# Patient Record
Sex: Female | Born: 1955 | Race: White | Hispanic: No | Marital: Married | Smoking: Former smoker
Health system: Southern US, Community
[De-identification: ages and names within clinical notes are randomized; demographics above are authoritative.]

## PROBLEM LIST (undated history)

## (undated) DIAGNOSIS — Z87442 Personal history of urinary calculi: Secondary | ICD-10-CM

## (undated) DIAGNOSIS — K760 Fatty (change of) liver, not elsewhere classified: Secondary | ICD-10-CM

## (undated) DIAGNOSIS — J449 Chronic obstructive pulmonary disease, unspecified: Secondary | ICD-10-CM

## (undated) DIAGNOSIS — F909 Attention-deficit hyperactivity disorder, unspecified type: Secondary | ICD-10-CM

## (undated) DIAGNOSIS — M1711 Unilateral primary osteoarthritis, right knee: Secondary | ICD-10-CM

## (undated) DIAGNOSIS — E559 Vitamin D deficiency, unspecified: Secondary | ICD-10-CM

## (undated) DIAGNOSIS — Z87898 Personal history of other specified conditions: Secondary | ICD-10-CM

## (undated) DIAGNOSIS — J301 Allergic rhinitis due to pollen: Secondary | ICD-10-CM

## (undated) DIAGNOSIS — U071 COVID-19: Secondary | ICD-10-CM

## (undated) DIAGNOSIS — M461 Sacroiliitis, not elsewhere classified: Secondary | ICD-10-CM

## (undated) DIAGNOSIS — G43109 Migraine with aura, not intractable, without status migrainosus: Secondary | ICD-10-CM

## (undated) DIAGNOSIS — K219 Gastro-esophageal reflux disease without esophagitis: Secondary | ICD-10-CM

## (undated) DIAGNOSIS — Z9884 Bariatric surgery status: Secondary | ICD-10-CM

## (undated) DIAGNOSIS — Z972 Presence of dental prosthetic device (complete) (partial): Secondary | ICD-10-CM

## (undated) DIAGNOSIS — G4733 Obstructive sleep apnea (adult) (pediatric): Secondary | ICD-10-CM

## (undated) HISTORY — DX: Allergic rhinitis due to pollen: J30.1

## (undated) HISTORY — PX: GASTRIC ROUX-EN-Y: SHX5262

## (undated) HISTORY — DX: Fatty (change of) liver, not elsewhere classified: K76.0

## (undated) HISTORY — DX: Bariatric surgery status: Z98.84

## (undated) HISTORY — DX: Vitamin D deficiency, unspecified: E55.9

## (undated) HISTORY — DX: Sacroiliitis, not elsewhere classified: M46.1

## (undated) HISTORY — DX: Personal history of other specified conditions: Z87.898

## (undated) HISTORY — DX: Unilateral primary osteoarthritis, right knee: M17.11

## (undated) HISTORY — DX: Gastro-esophageal reflux disease without esophagitis: K21.9

## (undated) HISTORY — DX: Migraine with aura, not intractable, without status migrainosus: G43.109

## (undated) HISTORY — PX: NASAL SINUS SURGERY: SHX719

## (undated) HISTORY — DX: Attention-deficit hyperactivity disorder, unspecified type: F90.9

## (undated) HISTORY — DX: Obstructive sleep apnea (adult) (pediatric): G47.33

---

## 2007-03-08 ENCOUNTER — Emergency Department: Payer: Self-pay | Admitting: Internal Medicine

## 2007-03-08 ENCOUNTER — Other Ambulatory Visit: Payer: Self-pay

## 2007-10-05 ENCOUNTER — Emergency Department: Payer: Self-pay | Admitting: Emergency Medicine

## 2007-12-16 ENCOUNTER — Emergency Department: Payer: Self-pay | Admitting: Emergency Medicine

## 2008-05-13 ENCOUNTER — Ambulatory Visit: Payer: Self-pay

## 2008-06-26 ENCOUNTER — Ambulatory Visit: Payer: Self-pay | Admitting: Family Medicine

## 2012-01-24 ENCOUNTER — Ambulatory Visit: Payer: Self-pay | Admitting: Family Medicine

## 2012-03-14 ENCOUNTER — Emergency Department: Payer: Self-pay | Admitting: Emergency Medicine

## 2012-07-20 ENCOUNTER — Emergency Department: Payer: Self-pay | Admitting: Internal Medicine

## 2013-02-24 ENCOUNTER — Emergency Department: Payer: Self-pay | Admitting: Emergency Medicine

## 2013-04-25 ENCOUNTER — Emergency Department: Payer: Self-pay | Admitting: Emergency Medicine

## 2014-01-19 ENCOUNTER — Emergency Department: Payer: Self-pay | Admitting: Emergency Medicine

## 2014-01-21 ENCOUNTER — Emergency Department: Payer: Self-pay | Admitting: Emergency Medicine

## 2014-01-25 ENCOUNTER — Emergency Department: Payer: Self-pay | Admitting: Internal Medicine

## 2014-02-04 ENCOUNTER — Emergency Department: Payer: Self-pay | Admitting: Emergency Medicine

## 2014-02-10 ENCOUNTER — Emergency Department: Payer: Self-pay | Admitting: Emergency Medicine

## 2014-07-14 ENCOUNTER — Emergency Department: Payer: Self-pay | Admitting: Emergency Medicine

## 2015-03-18 ENCOUNTER — Ambulatory Visit: Payer: Self-pay | Attending: Oncology | Admitting: *Deleted

## 2015-03-18 ENCOUNTER — Encounter (INDEPENDENT_AMBULATORY_CARE_PROVIDER_SITE_OTHER): Payer: Self-pay

## 2015-03-18 ENCOUNTER — Ambulatory Visit
Admission: RE | Admit: 2015-03-18 | Discharge: 2015-03-18 | Disposition: A | Discharge status: Home / Self Care | Payer: Self-pay | Source: Ambulatory Visit | Attending: Oncology | Admitting: Oncology

## 2015-03-18 ENCOUNTER — Encounter: Payer: Self-pay | Admitting: *Deleted

## 2015-03-18 VITALS — BP 121/76 | HR 73 | Temp 97.7°F | Ht 63.0 in | Wt 243.2 lb

## 2015-03-18 DIAGNOSIS — Z0000 Encounter for general adult medical examination without abnormal findings: Secondary | ICD-10-CM

## 2015-03-18 NOTE — Progress Notes (Signed)
Subjective:     Patient ID: Valerie Ford, female   DOB: Sep 12, 1955, 59 y.o.   MRN: [id number]  HPI   Review of Systems     Objective:   Physical Exam  Pulmonary/Chest: Right breast exhibits no inverted nipple, no mass, no nipple discharge, no skin change and no tenderness. Left breast exhibits no inverted nipple, no mass, no nipple discharge, no skin change and no tenderness.  Abdominal: There is no splenomegaly or hepatomegaly.  Genitourinary: Rectal exam shows no mass. No labial fusion. There is no rash, tenderness, lesion or injury on the right labia. There is no rash, tenderness, lesion or injury on the left labia. Cervix exhibits no motion tenderness, no discharge and no friability. Right adnexum displays no mass, no tenderness and no fullness. Left adnexum displays no mass, no tenderness and no fullness. No erythema, tenderness or bleeding in the vagina. No foreign body around the vagina. No signs of injury around the vagina. No vaginal discharge found.       Assessment:     59 year old White female returns to [facility] for annual screening.  Clinical breast exam unremarkable.  Taught self breast awareness.  Specimen collected for pap smear.  Patient has been screened for eligibility.  She does not have any insurance, Medicare or Medicaid.  She also meets financial eligibility.  Hand-out given on the Affordable Care Act.    Plan:     Screening mammogram ordered.  Specimen sent to the lab.  Follow up per protocol.

## 2015-03-18 NOTE — Patient Instructions (Signed)
Gave patient hand-out, Women Staying Healthy, Active and Well from [facility], with education on breast health, pap smears, heart and colon health. 

## 2015-03-20 LAB — PAP LB AND HPV HIGH-RISK
HPV, HIGH-RISK: NEGATIVE
PAP Smear Comment: 0

## 2015-03-23 ENCOUNTER — Encounter: Payer: Self-pay | Admitting: *Deleted

## 2015-11-08 ENCOUNTER — Emergency Department: Payer: Self-pay

## 2015-11-08 ENCOUNTER — Emergency Department
Admission: EM | Admit: 2015-11-08 | Discharge: 2015-11-08 | Disposition: A | Discharge status: Home / Self Care | Payer: Self-pay | Attending: Emergency Medicine | Admitting: Emergency Medicine

## 2015-11-08 ENCOUNTER — Encounter: Payer: Self-pay | Admitting: Emergency Medicine

## 2015-11-08 DIAGNOSIS — Z79899 Other long term (current) drug therapy: Secondary | ICD-10-CM | POA: Insufficient documentation

## 2015-11-08 DIAGNOSIS — Z87891 Personal history of nicotine dependence: Secondary | ICD-10-CM | POA: Insufficient documentation

## 2015-11-08 DIAGNOSIS — W28XXXA Contact with powered lawn mower, initial encounter: Secondary | ICD-10-CM | POA: Insufficient documentation

## 2015-11-08 DIAGNOSIS — Y999 Unspecified external cause status: Secondary | ICD-10-CM | POA: Insufficient documentation

## 2015-11-08 DIAGNOSIS — Y9389 Activity, other specified: Secondary | ICD-10-CM | POA: Insufficient documentation

## 2015-11-08 DIAGNOSIS — Y929 Unspecified place or not applicable: Secondary | ICD-10-CM | POA: Insufficient documentation

## 2015-11-08 DIAGNOSIS — T148XXA Other injury of unspecified body region, initial encounter: Secondary | ICD-10-CM

## 2015-11-08 DIAGNOSIS — M79671 Pain in right foot: Secondary | ICD-10-CM

## 2015-11-08 DIAGNOSIS — S9031XA Contusion of right foot, initial encounter: Secondary | ICD-10-CM | POA: Insufficient documentation

## 2015-11-08 MED ORDER — KETOROLAC TROMETHAMINE 60 MG/2ML IM SOLN
60.0000 mg | Freq: Once | INTRAMUSCULAR | Status: AC
  Administered 2015-11-08: 60 mg via INTRAMUSCULAR
  Filled 2015-11-08: qty 2

## 2015-11-08 MED ORDER — OXYCODONE-ACETAMINOPHEN 5-325 MG PO TABS
1.0000 | ORAL_TABLET | Freq: Once | ORAL | Status: AC
  Administered 2015-11-08: 1 via ORAL
  Filled 2015-11-08: qty 1

## 2015-11-08 MED ORDER — OXYCODONE-ACETAMINOPHEN 5-325 MG PO TABS: 1.0000 | ORAL_TABLET | Freq: Four times a day (QID) | ORAL | Status: DC | PRN

## 2015-11-08 NOTE — ED Notes (Signed)
Discussed discharge instructions, prescriptions, and follow-up care with patient. No questions or concerns at this time. Pt stable at discharge.  

## 2015-11-08 NOTE — Discharge Instructions (Signed)

## 2015-11-08 NOTE — ED Notes (Signed)
Ice pack applied to foot, pt's foot elevated.

## 2015-11-08 NOTE — ED Notes (Addendum)
Patient states that she was mowing and hit a rock. Patient states that the lawn mower threw a rock and hit her right foot. Patient with pain and swelling to right foot. Patient reports that she has taken tramadol and Toradol for the pain with no relief.

## 2015-11-08 NOTE — ED Provider Notes (Signed)
[facility] Emergency Department Provider Note   ____________________________________________  Time seen: Approximately 530 AM  I have reviewed the triage vital signs and the nursing notes.   HISTORY  Chief Complaint Foot Pain    HPI Valerie Ford is a 60 y.o. female who comes into the hospital today with foot pain. The patient reports that she thinks she broke her foot. Her son brought her a lawnmower today.  She reports that as she was mowing the lawn, the lawnmower hit a rock and the rock hit her right foot. The patient reports that she had some significant pain and swelling to her foot. She initially soaked it in cold water and then To. She reports that she tried to go to sleep but woke up with excruciating pain and she was trying to sleep. She goes for a few minutes. The patient rates her pain a 9 out of 10 in intensity. She is also having problems with a sinus infection and bronchitis. The patient reports that her foot is achy.The patient came in tonight to check for broken foot and to have her pain treated. The patient took some tramadol and Toradol at home with no relief.    Past Medical History  Diagnosis Date  . Arthritis     There are no active problems to display for this patient.   Past Surgical History  Procedure Laterality Date  . Cesarean section      Current Outpatient Rx  Name  Route  Sig  Dispense  Refill  . furosemide (LASIX) 20 MG tablet   Oral   Take 20 mg by mouth daily.         Marland Kitchen. omeprazole (PRILOSEC) 20 MG capsule   Oral   Take 20 mg by mouth daily.         . potassium chloride (K-DUR) 10 MEQ tablet   Oral   Take 10 mEq by mouth daily.         Marland Kitchen. oxyCODONE-acetaminophen (ROXICET) 5-325 MG tablet   Oral   Take 1 tablet by mouth every 6 (six) hours as needed.   12 tablet   0     Allergies Review of patient's allergies indicates no known allergies.  Family History  Problem Relation Age of Onset  .  Breast cancer Maternal Grandmother [id number]    Social History Social History  Substance Use Topics  . Smoking status: Former Games developermoker  . Smokeless tobacco: None  . Alcohol Use: No    Review of Systems Constitutional: No fever/chills Eyes: No visual changes. ENT: No sore throat. Cardiovascular: Denies chest pain. Respiratory: Denies shortness of breath. Gastrointestinal: No abdominal pain.  No nausea, no vomiting.  No diarrhea.  No constipation. Genitourinary: Negative for dysuria. Musculoskeletal: Right foot pain Skin: Negative for rash. Neurological: Negative for headaches, focal weakness or numbness.  10-point ROS otherwise negative.  ____________________________________________   PHYSICAL EXAM:  VITAL SIGNS: ED Triage Vitals  Enc Vitals Group     BP 11/08/15 0347 128/62 mmHg     Pulse Rate 11/08/15 0347 87     Resp 11/08/15 0347 22     Temp 11/08/15 0347 98 F (36.7 C)     Temp src --      SpO2 11/08/15 0347 95 %     Weight 11/08/15 0348 238 lb (107.956 kg)     Height 11/08/15 0348 5\' 4"  (1.626 m)     Head Cir --      Peak Flow --  Pain Score 11/08/15 0348 10     Pain Loc --      Pain Edu? --      Excl. in GC? --     Constitutional: Alert and oriented. Well appearing and in Moderate distress. Eyes: Conjunctivae are normal. PERRL. EOMI. Head: Atraumatic. Nose: No congestion/rhinnorhea. Mouth/Throat: Mucous membranes are moist.  Oropharynx non-erythematous. Cardiovascular: Normal rate, regular rhythm. Grossly normal heart sounds.  Good peripheral circulation. Respiratory: Normal respiratory effort.  No retractions. Lungs CTAB. Gastrointestinal: Soft and nontender. No distention. No abdominal bruits. No CVA tenderness. Musculoskeletal: Soft tissue swelling to the right foot with some erythema and tenderness to palpation. Neurologic:  Normal speech and language.  Skin:  Skin is warm, dry and intact.  Psychiatric: Mood and affect are normal.    ____________________________________________   LABS (all labs ordered are listed, but only abnormal results are displayed)  Labs Reviewed - No data to display ____________________________________________  EKG  None ____________________________________________  RADIOLOGY Right foot x-ray: No acute findings ____________________________________________   PROCEDURES  Procedure(s) performed: None  Critical Care performed: No  ____________________________________________   INITIAL IMPRESSION / ASSESSMENT AND PLAN / ED COURSE  Pertinent labs & imaging results that were available during my care of the patient were reviewed by me and considered in my medical decision making (see chart for details).  This is a 60 year old female who comes into the hospital with a right foot injury after mowing her lawn. The patient does not appear to have a foot fracture. I will give her a shot of Toradol as she is driving and reassess the patient's pain. She will also receive a postop shoe.  The patient will discharged to home. ____________________________________________   FINAL CLINICAL IMPRESSION(S) / ED DIAGNOSES  Final diagnoses:  Right foot pain  Contusion      NEW MEDICATIONS STARTED DURING THIS VISIT:  New Prescriptions   OXYCODONE-ACETAMINOPHEN (ROXICET) 5-325 MG TABLET    Take 1 tablet by mouth every 6 (six) hours as needed.     Note:  This document was prepared using Dragon voice recognition software and may include unintentional dictation errors.    Rebecka Apley, MD 11/08/15 [phone]

## 2016-02-05 ENCOUNTER — Emergency Department
Admission: EM | Admit: 2016-02-05 | Discharge: 2016-02-05 | Disposition: A | Discharge status: Home / Self Care | Payer: Self-pay | Attending: Emergency Medicine | Admitting: Emergency Medicine

## 2016-02-05 DIAGNOSIS — F410 Panic disorder [episodic paroxysmal anxiety] without agoraphobia: Secondary | ICD-10-CM | POA: Insufficient documentation

## 2016-02-05 DIAGNOSIS — Z87891 Personal history of nicotine dependence: Secondary | ICD-10-CM | POA: Insufficient documentation

## 2016-02-05 DIAGNOSIS — Z79899 Other long term (current) drug therapy: Secondary | ICD-10-CM | POA: Insufficient documentation

## 2016-02-05 DIAGNOSIS — T7840XA Allergy, unspecified, initial encounter: Secondary | ICD-10-CM | POA: Insufficient documentation

## 2016-02-05 DIAGNOSIS — T380X5A Adverse effect of glucocorticoids and synthetic analogues, initial encounter: Secondary | ICD-10-CM | POA: Insufficient documentation

## 2016-02-05 MED ORDER — DIPHENHYDRAMINE HCL 50 MG/ML IJ SOLN
50.0000 mg | Freq: Once | INTRAMUSCULAR | Status: AC
  Administered 2016-02-05: 50 mg via INTRAVENOUS
  Filled 2016-02-05: qty 1

## 2016-02-05 MED ORDER — LORAZEPAM 1 MG PO TABS: 1.0000 mg | ORAL_TABLET | Freq: Two times a day (BID) | ORAL | 0 refills | Status: AC

## 2016-02-05 MED ORDER — SODIUM CHLORIDE 0.9 % IV SOLN
Freq: Once | INTRAVENOUS | Status: AC
  Administered 2016-02-05: 18:00:00 via INTRAVENOUS

## 2016-02-05 MED ORDER — LORAZEPAM 2 MG/ML IJ SOLN
1.0000 mg | Freq: Once | INTRAMUSCULAR | Status: AC
  Administered 2016-02-05: 1 mg via INTRAVENOUS

## 2016-02-05 MED ORDER — LORAZEPAM 2 MG/ML IJ SOLN
INTRAMUSCULAR | Status: AC
  Administered 2016-02-05: 1 mg via INTRAVENOUS
  Filled 2016-02-05: qty 1

## 2016-02-05 NOTE — ED Triage Notes (Signed)
Pt states that she received an injection in her rt knee yesterday, pt states that she immediately started feeling hot, hot to touch, swollen areas at her neck, pt denies itching, states that she feels bad, states that she took 2 benadryl at 1400

## 2016-02-05 NOTE — ED Provider Notes (Signed)
[facility] Emergency Department Provider Note        Time seen: ----------------------------------------- 5:55 PM on 02/05/2016 -----------------------------------------    I have reviewed the triage vital signs and the nursing notes.   HISTORY  Chief Complaint Allergic Reaction    HPI Valerie Ford is a 60 y.o. female who presents to ERfor a possible allergic reaction. Patient states she received an injection in her right knee yesterday and states that she immediately started feeling hot, sweating, has noted facial swelling and hand swelling and it was hard to swallow. She states she took 2 Benadryl at 2:00. She denies recent illness, has had reactions to steroids in the past.   Past Medical History:  Diagnosis Date  . Arthritis     There are no active problems to display for this patient.   Past Surgical History:  Procedure Laterality Date  . CESAREAN SECTION      Allergies Review of patient's allergies indicates no known allergies.  Social History Social History  Substance Use Topics  . Smoking status: Former Games developer  . Smokeless tobacco: Not on file  . Alcohol use No    Review of Systems Constitutional: Negative for fever. Eyes: Negative for visual changes. ENT: Positive for sore throat Cardiovascular: Negative for chest pain. Respiratory: Negative for shortness of breath. Gastrointestinal: Negative for abdominal pain, vomiting and diarrhea. Genitourinary: Negative for dysuria. Musculoskeletal: Positive for right knee pain Skin: Positive for swelling and erythema Neurological: Negative for headaches, focal weakness or numbness.  10-point ROS otherwise negative.  ____________________________________________   PHYSICAL EXAM:  VITAL SIGNS: ED Triage Vitals [02/05/16 1729]  Enc Vitals Group     BP 140/70     Pulse Rate 80     Resp 18     Temp 98.3 F (36.8 C)     Temp Source Oral     SpO2 96 %     Weight 225 lb  (102.1 kg)     Height  (1.626 m)     Head Circumference      Peak Flow      Pain Score 7     Pain Loc      Pain Edu?      Excl. in GC?     Constitutional: Alert and oriented. Anxious, no acute distress Eyes: Conjunctivae are normal. PERRL. Normal extraocular movements. ENT   Head: Normocephalic and atraumatic.   Nose: No congestion/rhinnorhea.   Mouth/Throat: Mucous membranes are moist.   Neck: No stridor. Cardiovascular: Normal rate, regular rhythm. No murmurs, rubs, or gallops. Respiratory: Normal respiratory effort without tachypnea nor retractions. Breath sounds are clear and equal bilaterally. No wheezes/rales/rhonchi. Gastrointestinal: Soft and nontender. Normal bowel sounds Musculoskeletal: Nontender with normal range of motion in all extremities. No lower extremity tenderness nor edema. Neurologic:  Normal speech and language. No gross focal neurologic deficits are appreciated.  Skin:  Facial erythema is noted Psychiatric: Anxious mood and affect ___________________________________________  ED COURSE:  Pertinent labs & imaging results that were available during my care of the patient were reviewed by me and considered in my medical decision making (see chart for details). Patient is in no acute distress but appears anxious. Patient receive IV fluids as well as Benadryl and Ativan.  ____________________________________________  FINAL ASSESSMENT AND PLAN  Adverse medication reaction, anxiety attack  Plan: Patient with symptoms that started around the time of steroid injection. She may have a sensitivity but I doubt a true allergic reaction. She'll be encouraged to  continue taking Benadryl, take Ativan as needed.   Emily Filbert, MD   Note: This dictation was prepared with Dragon dictation. Any transcriptional errors that result from this process are unintentional    Emily Filbert, MD 02/05/16 [phone]

## 2016-02-05 NOTE — ED Notes (Signed)
Discharge instructions reviewed with patient. Patient verbalized understanding. Patient ambulated to lobby without difficulty.   

## 2016-10-20 DIAGNOSIS — G4733 Obstructive sleep apnea (adult) (pediatric): Secondary | ICD-10-CM | POA: Insufficient documentation

## 2016-10-20 HISTORY — DX: Obstructive sleep apnea (adult) (pediatric): G47.33

## 2016-10-27 DIAGNOSIS — R768 Other specified abnormal immunological findings in serum: Secondary | ICD-10-CM | POA: Insufficient documentation

## 2017-10-28 ENCOUNTER — Emergency Department
Admission: EM | Admit: 2017-10-28 | Discharge: 2017-10-28 | Disposition: A | Discharge status: Home / Self Care | Payer: BLUE CROSS/BLUE SHIELD | Attending: Emergency Medicine | Admitting: Emergency Medicine

## 2017-10-28 ENCOUNTER — Other Ambulatory Visit: Payer: Self-pay

## 2017-10-28 ENCOUNTER — Encounter: Payer: Self-pay | Admitting: Emergency Medicine

## 2017-10-28 ENCOUNTER — Emergency Department
Admission: EM | Admit: 2017-10-28 | Discharge: 2017-10-28 | Disposition: A | Discharge status: Home / Self Care | Payer: BLUE CROSS/BLUE SHIELD | Source: Home / Self Care

## 2017-10-28 DIAGNOSIS — R51 Headache: Secondary | ICD-10-CM

## 2017-10-28 DIAGNOSIS — Z79899 Other long term (current) drug therapy: Secondary | ICD-10-CM | POA: Diagnosis not present

## 2017-10-28 DIAGNOSIS — L03211 Cellulitis of face: Secondary | ICD-10-CM | POA: Diagnosis not present

## 2017-10-28 DIAGNOSIS — W57XXXA Bitten or stung by nonvenomous insect and other nonvenomous arthropods, initial encounter: Secondary | ICD-10-CM | POA: Diagnosis not present

## 2017-10-28 DIAGNOSIS — Z87891 Personal history of nicotine dependence: Secondary | ICD-10-CM | POA: Diagnosis not present

## 2017-10-28 DIAGNOSIS — Z5321 Procedure and treatment not carried out due to patient leaving prior to being seen by health care provider: Secondary | ICD-10-CM

## 2017-10-28 DIAGNOSIS — H11421 Conjunctival edema, right eye: Secondary | ICD-10-CM | POA: Diagnosis present

## 2017-10-28 DIAGNOSIS — S0006XA Insect bite (nonvenomous) of scalp, initial encounter: Secondary | ICD-10-CM

## 2017-10-28 MED ORDER — CEPHALEXIN 500 MG PO CAPS
500.0000 mg | ORAL_CAPSULE | Freq: Once | ORAL | Status: AC
  Administered 2017-10-28: 500 mg via ORAL
  Filled 2017-10-28: qty 1

## 2017-10-28 MED ORDER — CLINDAMYCIN HCL 300 MG PO CAPS: 300.0000 mg | ORAL_CAPSULE | Freq: Three times a day (TID) | ORAL | 0 refills | Status: DC

## 2017-10-28 MED ORDER — CLINDAMYCIN HCL 150 MG PO CAPS
300.0000 mg | ORAL_CAPSULE | Freq: Once | ORAL | Status: AC
  Administered 2017-10-28: 300 mg via ORAL
  Filled 2017-10-28: qty 2

## 2017-10-28 MED ORDER — CEPHALEXIN 500 MG PO CAPS: 500.0000 mg | ORAL_CAPSULE | Freq: Three times a day (TID) | ORAL | 0 refills | Status: DC

## 2017-10-28 NOTE — Discharge Instructions (Signed)
1.  Take antibiotics as prescribed: Keflex 500 mg 3 times daily for 7 days Clindamycin 300 mg 3 times daily for 7 days 2.  Apply cool compress to affected area several times daily to reduce swelling. 3.  Return to the ER for worsening symptoms, increased swelling/redness, purulent discharge, pain in your right eye, fever, vomiting or other concerns.

## 2017-10-28 NOTE — ED Triage Notes (Signed)
Pt c/o insect bite, unknown what kind, since  Tuesday above the right eye. Pt to ED this AM due to increased swelling above and around the right eye.

## 2017-10-28 NOTE — ED Provider Notes (Signed)
[facility] Emergency Department Provider Note   ____________________________________________   First MD Initiated Contact with Patient 10/28/17 0505     (approximate)  I have reviewed the triage vital signs and the nursing notes.   HISTORY  Chief Complaint Insect Bite    HPI Valerie Ford is a 62 y.o. female who presents to the ED from home with a chief complaint of insect bite.  Patient reports awakening 4 days ago with a small insect bite on her anterior chest and one above her right eye.  Over the course of the next few days, she has developed swelling to her right upper eyelid and a small scab to the site of the bite after she pressed on it.  Denies associated fever, chills, vision changes, eye pain, chest pain, shortness breath, abdominal pain, nausea, vomiting.  Denies recent travel or trauma.   Past Medical History:  Diagnosis Date  . Arthritis     There are no active problems to display for this patient.   Past Surgical History:  Procedure Laterality Date  . CESAREAN SECTION    . GASTRIC ROUX-EN-Y      Prior to Admission medications   Medication Sig Start Date End Date Taking? Authorizing Provider  cephALEXin (KEFLEX) 500 MG capsule Take 1 capsule (500 mg total) by mouth 3 (three) times daily. 10/28/17   Irean Hong, MD  clindamycin (CLEOCIN) 300 MG capsule Take 1 capsule (300 mg total) by mouth 3 (three) times daily. 10/28/17   Irean Hong, MD  furosemide (LASIX) 20 MG tablet Take 20 mg by mouth daily.    [provider]  omeprazole (PRILOSEC) 20 MG capsule Take 20 mg by mouth daily.    [provider]  oxyCODONE-acetaminophen (ROXICET) 5-325 MG tablet Take 1 tablet by mouth every 6 (six) hours as needed. 11/08/15   Rebecka Apley, MD  potassium chloride (K-DUR) 10 MEQ tablet Take 10 mEq by mouth daily.    [provider]    Allergies Patient has no known allergies.  Family History  Problem Relation  Age of Onset  . Breast cancer Maternal Grandmother 31    Social History Social History   Tobacco Use  . Smoking status: Former Games developer  . Smokeless tobacco: Never Used  Substance Use Topics  . Alcohol use: No  . Drug use: Never    Review of Systems  Constitutional: No fever/chills. Eyes: Positive for right eyelid swelling.  No visual changes. ENT: Positive for insect bite above right eye.  No sore throat. Cardiovascular: Denies chest pain. Respiratory: Denies shortness of breath. Gastrointestinal: No abdominal pain.  No nausea, no vomiting.  No diarrhea.  No constipation. Genitourinary: Negative for dysuria. Musculoskeletal: Negative for back pain. Skin: Negative for rash. Neurological: Negative for headaches, focal weakness or numbness.   ____________________________________________   PHYSICAL EXAM:  VITAL SIGNS: ED Triage Vitals [10/28/17 0323]  Enc Vitals Group     BP 121/68     Pulse Rate 72     Resp 17     Temp 97.6 F (36.4 C)     Temp Source Oral     SpO2 98 %     Weight      Height      Head Circumference      Peak Flow      Pain Score 8     Pain Loc      Pain Edu?      Excl. in GC?  Constitutional: Alert and oriented. Well appearing and in no acute distress. Eyes: Conjunctivae are normal. PERRL. EOMI. No pain on moving right extraocular muscles.  Right upper eyelid with mild edema. Head: Pinpoint scab with tiny surrounding induration to right forehead above eyebrow. Nose: No congestion/rhinnorhea. Mouth/Throat: Mucous membranes are moist.  Oropharynx non-erythematous. Neck: No stridor.   Hematological/Lymphatic/Immunilogical: No cervical lymphadenopathy. Cardiovascular: Normal rate, regular rhythm. Grossly normal heart sounds.  Good peripheral circulation. Respiratory: Normal respiratory effort.  No retractions. Lungs CTAB.  Tiny abrasion to anterior chest without evidence of active infection. Gastrointestinal: Soft and nontender. No  distention. No abdominal bruits. No CVA tenderness. Musculoskeletal: No lower extremity tenderness nor edema.  No joint effusions. Neurologic:  Normal speech and language. No gross focal neurologic deficits are appreciated. No gait instability. Skin:  Skin is warm, dry and intact. No rash noted. Psychiatric: Mood and affect are normal. Speech and behavior are normal.  ____________________________________________   LABS (all labs ordered are listed, but only abnormal results are displayed)  Labs Reviewed - No data to display ____________________________________________  EKG  None ____________________________________________  RADIOLOGY  ED MD interpretation: None  Official radiology report(s): No results found.  ____________________________________________   PROCEDURES  Procedure(s) performed: None  Procedures  Critical Care performed: No  ____________________________________________   INITIAL IMPRESSION / ASSESSMENT AND PLAN / ED COURSE  As part of my medical decision making, I reviewed the following data within the electronic MEDICAL RECORD NUMBER Nursing notes reviewed and incorporated, Old chart reviewed and Notes from prior ED visits   62 year old female with mild facial cellulitis status post insect bite.  Pinpoint scab with tiny area of induration without fluctuance to right forehead above eyebrow.  Mild right upper eyelid edema.  Extraocular movements are intact without right eye pain.  No evidence for septal cellulitis at this point.  Will start clindamycin for probable MRSA, Keflex for cellulitis, encourage cool compresses to reduce swelling.  Since it is Easter weekend and patient's PCP is closed on Monday, I have asked her to return to the ED in 2 days for recheck; sooner if her symptoms worsen.  Strict return precautions given.  Patient verbalizes understanding and agrees with plan of care.      ____________________________________________   FINAL CLINICAL  IMPRESSION(S) / ED DIAGNOSES  Final diagnoses:  Insect bite of scalp, initial encounter  Cellulitis of face     ED Discharge Orders        Ordered    cephALEXin (KEFLEX) 500 MG capsule  3 times daily     10/28/17 0524    clindamycin (CLEOCIN) 300 MG capsule  3 times daily     10/28/17 0524       Note:  This document was prepared using Dragon voice recognition software and may include unintentional dictation errors.    Irean HongSung, Sharbel Sahagun J, MD 10/28/17 [id number]

## 2017-10-28 NOTE — ED Triage Notes (Signed)
Patient diagnosed with facial cellulitis.  Patient states swelling is worse now.  Reports it was just above right eye, now swelling below.

## 2017-10-28 NOTE — ED Notes (Signed)
Unable to obtain pt's temperature at this time due to pt drinking coffee during triage.

## 2017-10-29 ENCOUNTER — Encounter: Payer: Self-pay | Admitting: Emergency Medicine

## 2017-10-29 ENCOUNTER — Other Ambulatory Visit: Payer: Self-pay

## 2017-10-29 ENCOUNTER — Emergency Department
Admission: EM | Admit: 2017-10-29 | Discharge: 2017-10-29 | Discharge status: Against Medical Advice | Payer: BLUE CROSS/BLUE SHIELD | Attending: Emergency Medicine | Admitting: Emergency Medicine

## 2017-10-29 DIAGNOSIS — Z79899 Other long term (current) drug therapy: Secondary | ICD-10-CM | POA: Diagnosis not present

## 2017-10-29 DIAGNOSIS — R220 Localized swelling, mass and lump, head: Secondary | ICD-10-CM | POA: Diagnosis present

## 2017-10-29 DIAGNOSIS — Z87891 Personal history of nicotine dependence: Secondary | ICD-10-CM | POA: Insufficient documentation

## 2017-10-29 DIAGNOSIS — L0201 Cutaneous abscess of face: Secondary | ICD-10-CM | POA: Diagnosis not present

## 2017-10-29 DIAGNOSIS — L0291 Cutaneous abscess, unspecified: Secondary | ICD-10-CM

## 2017-10-29 LAB — BASIC METABOLIC PANEL
Anion gap: 5 (ref 5–15)
BUN: 19 mg/dL (ref 6–20)
CALCIUM: 8.6 mg/dL — AB (ref 8.9–10.3)
CO2: 27 mmol/L (ref 22–32)
CREATININE: 0.66 mg/dL (ref 0.44–1.00)
Chloride: 107 mmol/L (ref 101–111)
GFR calc Af Amer: >60 mL/min (ref >60)
GLUCOSE: 91 mg/dL (ref 65–99)
Potassium: 3.5 mmol/L (ref 3.5–5.1)
Sodium: 139 mmol/L (ref 135–145)

## 2017-10-29 LAB — CBC
HCT: 39.2 % (ref 35.0–47.0)
Hemoglobin: 13.5 g/dL (ref 12.0–16.0)
MCH: 30.1 pg (ref 26.0–34.0)
MCHC: 34.4 g/dL (ref 32.0–36.0)
MCV: 87.3 fL (ref 80.0–100.0)
PLATELETS: 312 10*3/uL (ref 150–440)
RBC: 4.49 MIL/uL (ref 3.80–5.20)
RDW: 13.2 % (ref 11.5–14.5)
WBC: 8.1 10*3/uL (ref 3.6–11.0)

## 2017-10-29 MED ORDER — LIDOCAINE-EPINEPHRINE 2 %-1:100000 IJ SOLN
20.0000 mL | Freq: Once | INTRAMUSCULAR | Status: DC
  Filled 2017-10-29: qty 20

## 2017-10-29 NOTE — ED Notes (Signed)
Delay in charting discharge. Pt was discharge and ambulatory in NAD

## 2017-10-29 NOTE — ED Notes (Addendum)
Pt advised that we would be gathering supplies to lance the area on her face and would be with her shortly. Pt advises she is fine and thanks for the update, but she may need to leave.

## 2017-10-29 NOTE — ED Triage Notes (Signed)
Pt seen here with facial swelling on the right side and dx with facial cellulitis on Saturday morning. Pt states the swelling was worse yesterday evening. Pt here last night but left due to wait times.  Pt states she is taking Keflex and Clindamycin.  Pt states she has used ice packs, but sxs are not improving.

## 2017-10-29 NOTE — ED Notes (Addendum)
Pt left before we could assess her cellulitis on her face. MD informed pt that we would lance the area. Pt advised MD that she agreed, but was in a hurry and may have to leave.  Pt ambulatory and in NAD

## 2017-10-29 NOTE — ED Provider Notes (Signed)
[facility] Emergency Department Provider Note  ____________________________________________   I have reviewed the triage vital signs and the nursing notes. Where available I have reviewed prior notes and, if possible and indicated, outside hospital notes.    HISTORY  Chief Complaint Eye Problem    HPI Valerie Ford is a 62 y.o. female who has a abscess or swelling above her right eye after she believes she was been admitted by some sort of a insect.  In any event, it is been there for a few days, she has been started on Clinda and Keflex, she has had no fevers or systemic illness, there is been swelling around that area which actually in some ways is not worse than it was and seems to be better generally speaking but there is focal swelling above the eyebrow.  She wants to make sure that it is not going to "go to her brain".  She also states that she is in a hurry and wants to go home and she has no ocular complaints she has no pain with ranging her eye itself, no diplopia, no headache etc.    Past Medical History:  Diagnosis Date  . Arthritis     There are no active problems to display for this patient.   Past Surgical History:  Procedure Laterality Date  . CESAREAN SECTION    . GASTRIC ROUX-EN-Y      Prior to Admission medications   Medication Sig Start Date End Date Taking? Authorizing Provider  cephALEXin (KEFLEX) 500 MG capsule Take 1 capsule (500 mg total) by mouth 3 (three) times daily. 10/28/17   Irean HongSung, Jade J, MD  clindamycin (CLEOCIN) 300 MG capsule Take 1 capsule (300 mg total) by mouth 3 (three) times daily. 10/28/17   Irean HongSung, Jade J, MD  furosemide (LASIX) 20 MG tablet Take 20 mg by mouth daily.    [provider]  omeprazole (PRILOSEC) 20 MG capsule Take 20 mg by mouth daily.    [provider]  oxyCODONE-acetaminophen (ROXICET) 5-325 MG tablet Take 1 tablet by mouth every 6 (six) hours as needed. 11/08/15   Rebecka ApleyWebster, Allison  P, MD  potassium chloride (K-DUR) 10 MEQ tablet Take 10 mEq by mouth daily.    [provider]    Allergies Patient has no known allergies.  Family History  Problem Relation Age of Onset  . Breast cancer Maternal Grandmother 4155    Social History Social History   Tobacco Use  . Smoking status: Former Games developermoker  . Smokeless tobacco: Never Used  Substance Use Topics  . Alcohol use: No  . Drug use: Never    Review of Systems Constitutional: No fever/chills Eyes: No visual changes. ENT: No sore throat. No stiff neck no neck pain Cardiovascular: Denies chest pain. Respiratory: Denies shortness of breath. Gastrointestinal:   no vomiting.  No diarrhea.  No constipation. Genitourinary: Negative for dysuria. Musculoskeletal: Negative lower extremity swelling Skin: See HPI Neurological: Negative for severe headaches, focal weakness or numbness.   ____________________________________________   PHYSICAL EXAM:  VITAL SIGNS: ED Triage Vitals  Enc Vitals Group     BP 10/29/17 1005 134/72     Pulse Rate 10/29/17 1005 70     Resp 10/29/17 1005 18     Temp 10/29/17 1005 98.5 F (36.9 C)     Temp Source 10/29/17 1005 Oral     SpO2 10/29/17 1005 96 %     Weight 10/29/17 1011 168 lb (76.2 kg)  Height 10/29/17 1011 5\' 4"  (1.626 m)     Head Circumference --      Peak Flow --      Pain Score 10/29/17 1011 8     Pain Loc --      Pain Edu? --      Excl. in GC? --     Constitutional: Alert and oriented. Well appearing and in no acute distress. Eyes: Conjunctivae are normal Head: Atraumatic HEENT: No congestion/rhinnorhea. Mucous membranes are moist.  Oropharynx non-erythematous there is a partially 1.5% indurated area with a small scab on it on top of the right eyebrow, it is not markedly fluctuant but it is tender and mildly erythematous.  There is very slight erythema noted below the right eye as well.  There is no ocular involvement that is states she has painless  motion of her eyes no proptosis is noted, Neck:   Nontender with no meningismus, no masses, no stridor Cardiovascular: Normal rate, regular rhythm. Grossly normal heart sounds.  Good peripheral circulation. Respiratory: Normal respiratory effort.  No retractions. Lungs CTAB. Abdominal: Soft and nontender. No distention. No guarding no rebound Back:  There is no focal tenderness or step off.  there is no midline tenderness there are no lesions noted. there is no CVA tenderness Musculoskeletal: No lower extremity tenderness, no upper extremity tenderness. No joint effusions, no DVT signs strong distal pulses no edema Neurologic:  Normal speech and language. No gross focal neurologic deficits are appreciated.  Skin:  Skin is warm, dry and intact. No rash noted. Psychiatric: Mood and affect are normal. Speech and behavior are normal.  ____________________________________________   LABS (all labs ordered are listed, but only abnormal results are displayed)  Labs Reviewed  BASIC METABOLIC PANEL - Abnormal; Notable for the following components:      Result Value   Calcium 8.6 (*)    All other components within normal limits  AEROBIC CULTURE (SUPERFICIAL SPECIMEN)  CBC    Pertinent labs  results that were available during my care of the patient were reviewed by me and considered in my medical decision making (see chart for details). ____________________________________________  EKG  I personally interpreted any EKGs ordered by me or triage  ____________________________________________  RADIOLOGY  Pertinent labs & imaging results that were available during my care of the patient were reviewed by me and considered in my medical decision making (see chart for details). If possible, patient and/or family made aware of any abnormal findings.  No results found. ____________________________________________    PROCEDURES  Procedure(s) performed: None  Procedures  Critical Care  performed: None  ____________________________________________   INITIAL IMPRESSION / ASSESSMENT AND PLAN / ED COURSE  Pertinent labs & imaging results that were available during my care of the patient were reviewed by me and considered in my medical decision making (see chart for details).  Patient seen in the emergency room, for a small either abscess or phlegmon above her right eyebrow which is infectious in origin, patient is on antibiotics.  She is absolutely nontoxic white count is very reassuring no evidence of orbital cellulitis, no pain with ranging of the eyes and no ocular complaint, she does have a small area of induration and I did offer to try to lance it to see if an expression of purulent material which anticipate is likely present would help her feel better.  Patient did consent to this however, a critical care patient came to the emergency room my return to the patient's bedside she  had left.  Did express earlier that she might not stay.  Obviously, patient is alert from the emergency department there is a limits of the care that we can provide.  She is however on adequate antibiotics and has no evidence of significant pathology noted.  I think she would benefit from having an I&D, but as she elected to elope this did not happen.    ____________________________________________   FINAL CLINICAL IMPRESSION(S) / ED DIAGNOSES  Final diagnoses:  None      This chart was dictated using voice recognition software.  Despite best efforts to proofread,  errors can occur which can change meaning.      Jeanmarie Plant, MD 10/29/17 [phone]

## 2017-10-29 NOTE — ED Notes (Signed)
First Nurse Note:  Patient states Dr. Dolores FrameSung called her to come back this morning.  She came last night but the "wait was too long."

## 2017-10-29 NOTE — ED Notes (Signed)
Pt CC d/w Dr. Marisa SeverinSiadecki, new orders received for lab work.

## 2018-01-07 DIAGNOSIS — Z9884 Bariatric surgery status: Secondary | ICD-10-CM

## 2018-01-07 DIAGNOSIS — M1711 Unilateral primary osteoarthritis, right knee: Secondary | ICD-10-CM

## 2018-01-07 DIAGNOSIS — K760 Fatty (change of) liver, not elsewhere classified: Secondary | ICD-10-CM

## 2018-01-07 DIAGNOSIS — K219 Gastro-esophageal reflux disease without esophagitis: Secondary | ICD-10-CM

## 2018-01-07 DIAGNOSIS — M461 Sacroiliitis, not elsewhere classified: Secondary | ICD-10-CM | POA: Insufficient documentation

## 2018-01-07 HISTORY — DX: Sacroiliitis, not elsewhere classified: M46.1

## 2018-01-07 HISTORY — DX: Unilateral primary osteoarthritis, right knee: M17.11

## 2018-01-07 HISTORY — DX: Bariatric surgery status: Z98.84

## 2018-01-07 HISTORY — DX: Gastro-esophageal reflux disease without esophagitis: K21.9

## 2018-01-07 HISTORY — DX: Fatty (change of) liver, not elsewhere classified: K76.0

## 2018-01-07 NOTE — Progress Notes (Signed)
Valerie LoopSusan D Ford is a 62 y.o. female [facility]   Patient Care Team: Helane RimaWallace, Azlyn Wingler, DO as PCP - General (Family Medicine) Lauretta GrillWoody, Jennifer R, NP as Nurse Practitioner (Nurse Practitioner) Everette Rankyner, Michael A, MD [facility](Bariatrics)   History of Present Illness:   Valerie MortJoEllen Ford, CMA acting as scribe for Dr. Helane RimaErica Catha Ford.   HPI: Patient in office to establish care. She has had head ache daily for over eight months. Only taking over the counter medications for it at this time. She has been seen by ED and old PCP with no help. Started about two years ago. She has been taking classes online but had had to take the summer and fall off to see if she figure out why she is having. She has had migraine for 9 days straight until the weekend. She has sensitivity to light and pressure and sound sensitivity and visual changes. She has been sent to neurology but has not been able to get an appointment. We will be placing order for new referral.   All medications have been reviewed as well as history with patient. [facility]. She noticed that her blood pressure has increased.   Health Maintenance Due  Topic Date Due  . Hepatitis C Screening  [birth year]  . HIV Screening  02/18/1970  . TETANUS/TDAP  02/18/1974  . COLONOSCOPY  02/18/2005  . MAMMOGRAM  03/17/2017   Depression screen PHQ 2/9 01/08/2018  Decreased Interest 0  Down, Depressed, Hopeless 0  PHQ - 2 Score 0    PMHx, SurgHx, SocialHx, Medications, and Allergies were reviewed in the Visit Navigator and updated as appropriate.   Past Medical History:  Diagnosis Date  . ADHD 01/08/2018  . Fatty liver 01/07/2018  . GERD (gastroesophageal reflux disease) 01/07/2018  . History of bariatric surgery 01/07/2018  . History of cocaine use, remote, > 20 years 01/11/2018  . Migraine with aura and without status migrainosus, not intractable 01/11/2018  . Obstructive sleep apnea syndrome 10/20/2016  .  Osteoarthritis of right knee 01/07/2018  . Sacroiliitis (HCC) 01/07/2018  . Seasonal allergic rhinitis due to pollen 01/11/2018  . Vitamin D deficiency 01/11/2018     Past Surgical History:  Procedure Laterality Date  . CESAREAN SECTION    . GASTRIC ROUX-EN-Y       Family History  Problem Relation Age of Onset  . Breast cancer Maternal Grandmother 6255    Social History   Tobacco Use  . Smoking status: Former Games developermoker  . Smokeless tobacco: Never Used  Substance Use Topics  . Alcohol use: No  . Drug use: Not Currently    Current Medications and Allergies:   .  amphetamine-dextroamphetamine (ADDERALL) 10 MG tablet, Take 10 mg by mouth 2 (two) times daily. .  celecoxib (CELEBREX) 200 MG capsule, celecoxib 200 mg capsule, Disp: , Rfl:  .  cetirizine (ZYRTEC) 10 MG tablet, Take by mouth., Disp: , Rfl:  .  Cholecalciferol (VITAMIN D3) 2000 units capsule, Take by mouth., Disp: , Rfl:  .  Cyanocobalamin (NASCOBAL) 500 MCG/0.1ML SOLN, Nascobal 500 mcg/spray nasal spray   .  cyanocobalamin (TH VITAMIN B12) 100 MCG tablet, Take by mouth., Disp: , Rfl:  .  furosemide (LASIX) 20 MG tablet, Take 20 mg by mouth daily., Disp: , Rfl:  .  omeprazole (PRILOSEC) 20 MG capsule, Take 20 mg by mouth daily., Disp: , Rfl:  .  potassium chloride (K-DUR) 10 MEQ tablet, Take 10 mEq  by mouth daily., Disp: , Rfl:    Allergies  Allergen Reactions  . Sulfa Antibiotics Rash    Other reaction(s): red ears Red and swollen ears Other reaction(s): red ears Other reaction(s): red ears   . Codeine Nausea And Vomiting  . Iodinated Diagnostic Agents Nausea And Vomiting    Other reaction(s): Nausea And Vomiting Patient reports history of nausea and vomiting after IV contast dye. Approximately 35 years ago. approximately 35 years ago. Patient reports history of nausea and vomiting after IV contast dye. Approximately 35 years ago. approximately 35 years ago. Patient reports history of nausea and vomiting after IV  contast dye. Approximately 35 years ago.   . Poison Ivy Extract Rash  . Tramadol Nausea And Vomiting    Migraine Migraine Migraine    Review of Systems:   Pertinent items are noted in the HPI. Otherwise, ROS is negative.  Vitals:   Vitals:   01/08/18 1517  BP: 140/72  Pulse: 68  Temp: 98.4 F (36.9 C)  TempSrc: Oral  SpO2: 97%  Weight: 171 lb 6.4 oz (77.7 kg)  Height: 5' 3.75" (1.619 m)     Body mass index is 29.65 kg/m.  Physical Exam:   Physical Exam  Constitutional: She is oriented to person, place, and time. She appears well-developed and well-nourished. No distress.  HENT:  Head: Normocephalic and atraumatic.  Right Ear: External ear normal.  Left Ear: External ear normal.  Nose: Nose normal.  Mouth/Throat: Oropharynx is clear and moist.  Eyes: Pupils are equal, round, and reactive to light. Conjunctivae and EOM are normal.  Neck: Normal range of motion. Neck supple. No thyromegaly present.  Cardiovascular: Normal rate, regular rhythm, normal heart sounds and intact distal pulses.  Pulmonary/Chest: Effort normal and breath sounds normal.  Abdominal: Soft. Bowel sounds are normal.  Musculoskeletal: Normal range of motion.  Lymphadenopathy:    She has no cervical adenopathy.  Neurological: She is alert and oriented to person, place, and time.  Skin: Skin is warm and dry. Capillary refill takes less than 2 seconds.  Psychiatric: She has a normal mood and affect. Her behavior is normal.  Nursing note and vitals reviewed.   Results for orders placed or performed during the hospital encounter of 10/29/17  CBC  Result Value Ref Range   WBC 8.1 3.6 - 11.0 K/uL   RBC 4.49 3.80 - 5.20 MIL/uL   Hemoglobin 13.5 12.0 - 16.0 g/dL   HCT 52.8 41.3 - 24.4 %   MCV 87.3 80.0 - 100.0 fL   MCH 30.1 26.0 - 34.0 pg   MCHC 34.4 32.0 - 36.0 g/dL   RDW 01.0 27.2 - 53.6 %   Platelets 312 150 - 440 K/uL  Basic metabolic panel  Result Value Ref Range   Sodium 139 135 - 145  mmol/L   Potassium 3.5 3.5 - 5.1 mmol/L   Chloride 107 101 - 111 mmol/L   CO2 27 22 - 32 mmol/L   Glucose, Bld 91 65 - 99 mg/dL   BUN 19 6 - 20 mg/dL   Creatinine, Ser 6.44 0.44 - 1.00 mg/dL   Calcium 8.6 (L) 8.9 - 10.3 mg/dL   GFR calc non Af Amer >60 >60 mL/min   GFR calc Af Amer >60 >60 mL/min   Anion gap 5 5 - 15    Assessment and Plan:   Patient UPDATED Active Problem List   Diagnosis Date Noted  . Migraine with aura and without status migrainosus, not intractable 01/11/2018  .  History of cocaine use, remote, > 20 years 01/11/2018  . Seasonal allergic rhinitis due to pollen 01/11/2018  . Vitamin D deficiency 01/11/2018  . Obesity (BMI 30-39.9), s/p bariatric surgery 01/11/2018  . ADHD, followed by Psych, on low dose Adderall 01/08/2018  . Fatty liver 01/07/2018  . History of bariatric surgery, RNY 01/07/2018  . GERD (gastroesophageal reflux disease) 01/07/2018  . Osteoarthritis of right knee 01/07/2018  . Sacroiliitis (HCC) 01/07/2018  . Obstructive sleep apnea syndrome, not on CPAP since weight loss 10/20/2016    Charlette was seen today for establish care.  Diagnoses and all orders for this visit:  Migraine with aura and without status migrainosus, not intractable Comments: Will see if we can expedite referral to Dr. Sherryll Burger at [facility]. Needs eye exam. Will trial below treatment.  Orders: -     Ambulatory referral to Neurology -     topiramate (TOPAMAX) 25 MG tablet; Take 1 tablet (25 mg total) by mouth 2 (two) times daily. -     ondansetron (ZOFRAN ODT) 4 MG disintegrating tablet; Take 1 tablet (4 mg total) by mouth every 8 (eight) hours as needed for nausea or vomiting. -     Butalbital-APAP-Caffeine (FIORICET) 50-300-40 MG CAPS; Take 1 tablet by mouth daily as needed.  Fatty liver  History of bariatric surgery  Gastroesophageal reflux disease, esophagitis presence not specified  Primary osteoarthritis of right knee  Sacroiliitis (HCC)  Encounter for  screening for HIV -     HIV antibody  Need for hepatitis C screening test -     Hepatitis C antibody  Screening for breast cancer -     MM 3D SCREEN BREAST BILATERAL; Future  Attention deficit hyperactivity disorder (ADHD), unspecified ADHD type  History of cocaine use, remote, > 20 years  Seasonal allergic rhinitis due to pollen  Vitamin D deficiency  Obesity (BMI 30-39.9)    . Reviewed expectations re: course of current medical issues. . Discussed self-management of symptoms. . Outlined signs and symptoms indicating need for more acute intervention. . Patient verbalized understanding and all questions were answered. Marland Kitchen Health Maintenance issues including appropriate healthy diet, exercise, and smoking avoidance were discussed with patient. . See orders for this visit as documented in the electronic medical record. . Patient received an After Visit Summary.  CMA served as Neurosurgeon during this visit. History, Physical, and Plan performed by medical provider. The above documentation has been reviewed and is accurate and complete. Helane Rima, D.O.  Helane Rima, DO [place], [facility] 01/11/2018

## 2018-01-08 ENCOUNTER — Encounter: Payer: Self-pay | Admitting: Family Medicine

## 2018-01-08 ENCOUNTER — Ambulatory Visit (INDEPENDENT_AMBULATORY_CARE_PROVIDER_SITE_OTHER): Payer: BLUE CROSS/BLUE SHIELD | Admitting: Family Medicine

## 2018-01-08 VITALS — BP 140/72 | HR 68 | Temp 98.4°F | Ht 63.75 in | Wt 171.4 lb

## 2018-01-08 DIAGNOSIS — G43109 Migraine with aura, not intractable, without status migrainosus: Secondary | ICD-10-CM

## 2018-01-08 DIAGNOSIS — K219 Gastro-esophageal reflux disease without esophagitis: Secondary | ICD-10-CM

## 2018-01-08 DIAGNOSIS — Z1239 Encounter for other screening for malignant neoplasm of breast: Secondary | ICD-10-CM

## 2018-01-08 DIAGNOSIS — E669 Obesity, unspecified: Secondary | ICD-10-CM

## 2018-01-08 DIAGNOSIS — E559 Vitamin D deficiency, unspecified: Secondary | ICD-10-CM

## 2018-01-08 DIAGNOSIS — M1711 Unilateral primary osteoarthritis, right knee: Secondary | ICD-10-CM

## 2018-01-08 DIAGNOSIS — F909 Attention-deficit hyperactivity disorder, unspecified type: Secondary | ICD-10-CM

## 2018-01-08 DIAGNOSIS — Z1159 Encounter for screening for other viral diseases: Secondary | ICD-10-CM

## 2018-01-08 DIAGNOSIS — M461 Sacroiliitis, not elsewhere classified: Secondary | ICD-10-CM | POA: Diagnosis not present

## 2018-01-08 DIAGNOSIS — K760 Fatty (change of) liver, not elsewhere classified: Secondary | ICD-10-CM | POA: Diagnosis not present

## 2018-01-08 DIAGNOSIS — J301 Allergic rhinitis due to pollen: Secondary | ICD-10-CM

## 2018-01-08 DIAGNOSIS — Z1231 Encounter for screening mammogram for malignant neoplasm of breast: Secondary | ICD-10-CM

## 2018-01-08 DIAGNOSIS — Z9884 Bariatric surgery status: Secondary | ICD-10-CM

## 2018-01-08 DIAGNOSIS — Z114 Encounter for screening for human immunodeficiency virus [HIV]: Secondary | ICD-10-CM

## 2018-01-08 DIAGNOSIS — Z87898 Personal history of other specified conditions: Secondary | ICD-10-CM | POA: Diagnosis not present

## 2018-01-08 DIAGNOSIS — F1491 Cocaine use, unspecified, in remission: Secondary | ICD-10-CM

## 2018-01-08 HISTORY — DX: Attention-deficit hyperactivity disorder, unspecified type: F90.9

## 2018-01-08 MED ORDER — TOPIRAMATE 25 MG PO TABS: 25.0000 mg | ORAL_TABLET | Freq: Two times a day (BID) | ORAL | 3 refills | Status: DC

## 2018-01-08 MED ORDER — ONDANSETRON 4 MG PO TBDP: 4.0000 mg | ORAL_TABLET | Freq: Three times a day (TID) | ORAL | 0 refills | Status: DC | PRN

## 2018-01-08 MED ORDER — BUTALBITAL-APAP-CAFFEINE 50-300-40 MG PO CAPS: 1.0000 | ORAL_CAPSULE | Freq: Every day | ORAL | 1 refills | Status: DC | PRN

## 2018-01-08 NOTE — Patient Instructions (Addendum)
Call for an EYE EXAM [facility]. Order has been put in for you.

## 2018-01-09 ENCOUNTER — Telehealth: Payer: Self-pay | Admitting: Family Medicine

## 2018-01-09 NOTE — Telephone Encounter (Unsigned)
Copied from CRM [id number]. Topic: Quick Communication - See Telephone Encounter >> Jan 09, 2018 10:58 AM Floria RavelingStovall, Shana A wrote: CRM for notification. See Telephone encounter for: 01/09/18.  Pt called in just wanted to let Dr know that she got eyes checked.  She only had to have a light script so she doesn't feel as if her eyes are causing the headaches

## 2018-01-09 NOTE — Telephone Encounter (Signed)
See note

## 2018-01-09 NOTE — Telephone Encounter (Signed)
Dr. Earlene PlaterWallace, see message.FYI

## 2018-01-11 ENCOUNTER — Encounter: Payer: Self-pay | Admitting: Family Medicine

## 2018-01-11 DIAGNOSIS — F1491 Cocaine use, unspecified, in remission: Secondary | ICD-10-CM

## 2018-01-11 DIAGNOSIS — E669 Obesity, unspecified: Secondary | ICD-10-CM | POA: Insufficient documentation

## 2018-01-11 DIAGNOSIS — G43109 Migraine with aura, not intractable, without status migrainosus: Secondary | ICD-10-CM

## 2018-01-11 DIAGNOSIS — Z87898 Personal history of other specified conditions: Secondary | ICD-10-CM

## 2018-01-11 DIAGNOSIS — E559 Vitamin D deficiency, unspecified: Secondary | ICD-10-CM | POA: Insufficient documentation

## 2018-01-11 DIAGNOSIS — J301 Allergic rhinitis due to pollen: Secondary | ICD-10-CM | POA: Insufficient documentation

## 2018-01-11 HISTORY — DX: Vitamin D deficiency, unspecified: E55.9

## 2018-01-11 HISTORY — DX: Cocaine use, unspecified, in remission: F14.91

## 2018-01-11 HISTORY — DX: Personal history of other specified conditions: Z87.898

## 2018-01-11 HISTORY — DX: Migraine with aura, not intractable, without status migrainosus: G43.109

## 2018-01-11 HISTORY — DX: Allergic rhinitis due to pollen: J30.1

## 2018-01-12 ENCOUNTER — Encounter: Payer: Self-pay | Admitting: Family Medicine

## 2018-01-12 ENCOUNTER — Ambulatory Visit (INDEPENDENT_AMBULATORY_CARE_PROVIDER_SITE_OTHER): Payer: BLUE CROSS/BLUE SHIELD | Admitting: Family Medicine

## 2018-01-12 VITALS — BP 136/70 | HR 64 | Temp 98.6°F | Ht 63.75 in | Wt 171.6 lb

## 2018-01-12 DIAGNOSIS — R51 Headache: Secondary | ICD-10-CM | POA: Diagnosis not present

## 2018-01-12 DIAGNOSIS — G4486 Cervicogenic headache: Secondary | ICD-10-CM

## 2018-01-12 DIAGNOSIS — Z1211 Encounter for screening for malignant neoplasm of colon: Secondary | ICD-10-CM

## 2018-01-12 MED ORDER — OXYCODONE-ACETAMINOPHEN 5-325 MG PO TABS: 1.0000 | ORAL_TABLET | ORAL | 0 refills | Status: DC | PRN

## 2018-01-12 MED ORDER — PROPRANOLOL HCL 10 MG PO TABS: 10.0000 mg | ORAL_TABLET | Freq: Three times a day (TID) | ORAL | 0 refills | Status: DC

## 2018-01-12 MED ORDER — BACLOFEN 10 MG PO TABS: 10.0000 mg | ORAL_TABLET | Freq: Three times a day (TID) | ORAL | 0 refills | Status: DC
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
## 2018-01-12 NOTE — Progress Notes (Deleted)
Debroah LoopSusan D Eastridge is a 62 y.o. female is here for follow up.  History of Present Illness:   HPI:   Headaches continue daily, usually starts in occiput, band-like, but with blurry vision, nausea, photo and phonophobia.   Health Maintenance Due  Topic Date Due  . Hepatitis C Screening  November 07, 1955  . HIV Screening  02/18/1970  . TETANUS/TDAP  02/18/1974  . COLONOSCOPY  02/18/2005  . MAMMOGRAM  03/17/2017   Depression screen PHQ 2/9 01/08/2018  Decreased Interest 0  Down, Depressed, Hopeless 0  PHQ - 2 Score 0   PMHx, SurgHx, SocialHx, FamHx, Medications, and Allergies were reviewed in the Visit Navigator and updated as appropriate.   Patient Active Problem List   Diagnosis Date Noted  . Migraine with aura and without status migrainosus, not intractable 01/11/2018  . History of cocaine use, remote, > 20 years 01/11/2018  . Seasonal allergic rhinitis due to pollen 01/11/2018  . Vitamin D deficiency 01/11/2018  . Obesity (BMI 30-39.9), s/p bariatric surgery 01/11/2018  . ADHD, followed by Psych, on low dose Adderall 01/08/2018  . Fatty liver 01/07/2018  . History of bariatric surgery, RNY 01/07/2018  . GERD (gastroesophageal reflux disease) 01/07/2018  . Osteoarthritis of right knee 01/07/2018  . Sacroiliitis (HCC) 01/07/2018  . Obstructive sleep apnea syndrome, not on CPAP since weight loss 10/20/2016   Social History   Tobacco Use  . Smoking status: Former Games developermoker  . Smokeless tobacco: Never Used  Substance Use Topics  . Alcohol use: No  . Drug use: Not Currently   Current Medications and Allergies:   Current Outpatient Medications:  .  amphetamine-dextroamphetamine (ADDERALL) 10 MG tablet, Take 10 mg by mouth 2 (two) times daily., Disp: , Rfl: 0 .  Butalbital-APAP-Caffeine (FIORICET) 50-300-40 MG CAPS, Take 1 tablet by mouth daily as needed., Disp: 30 capsule, Rfl: 1 .  celecoxib (CELEBREX) 200 MG capsule, celecoxib 200 mg capsule, Disp: , Rfl:  .  cetirizine (ZYRTEC)  10 MG tablet, Take by mouth., Disp: , Rfl:  .  Cholecalciferol (VITAMIN D3) 2000 units capsule, Take by mouth., Disp: , Rfl:  .  Cyanocobalamin (NASCOBAL) 500 MCG/0.1ML SOLN, Nascobal 500 mcg/spray nasal spray  Spray 1 spray every week by intranasal route for 30 days., Disp: , Rfl:  .  cyanocobalamin (TH VITAMIN B12) 100 MCG tablet, Take by mouth., Disp: , Rfl:  .  furosemide (LASIX) 20 MG tablet, Take 20 mg by mouth daily., Disp: , Rfl:  .  omeprazole (PRILOSEC) 20 MG capsule, Take 20 mg by mouth daily., Disp: , Rfl:  .  ondansetron (ZOFRAN ODT) 4 MG disintegrating tablet, Take 1 tablet (4 mg total) by mouth every 8 (eight) hours as needed for nausea or vomiting., Disp: 20 tablet, Rfl: 0 .  potassium chloride (K-DUR) 10 MEQ tablet, Take 10 mEq by mouth daily., Disp: , Rfl:  .  topiramate (TOPAMAX) 25 MG tablet, Take 1 tablet (25 mg total) by mouth 2 (two) times daily., Disp: 30 tablet, Rfl: 3  Allergies  Allergen Reactions  . Sulfa Antibiotics Rash    Other reaction(s): red ears Red and swollen ears Other reaction(s): red ears Other reaction(s): red ears   . Codeine Nausea And Vomiting  . Iodinated Diagnostic Agents Nausea And Vomiting    Other reaction(s): Nausea And Vomiting Patient reports history of nausea and vomiting after IV contast dye. Approximately 35 years ago. approximately 35 years ago. Patient reports history of nausea and vomiting after IV contast dye. Approximately 35  years ago. approximately 35 years ago. Patient reports history of nausea and vomiting after IV contast dye. Approximately 35 years ago.   . Poison Ivy Extract Rash  . Tramadol Nausea And Vomiting    Migraine Migraine Migraine    Review of Systems   Pertinent items are noted in the HPI. Otherwise, ROS is negative.  Vitals:  There were no vitals filed for this visit.   There is no height or weight on file to calculate BMI.  Physical Exam:   Physical Exam Results for orders placed or performed  during the hospital encounter of 10/29/17  CBC  Result Value Ref Range   WBC 8.1 3.6 - 11.0 K/uL   RBC 4.49 3.80 - 5.20 MIL/uL   Hemoglobin 13.5 12.0 - 16.0 g/dL   HCT 96.0 45.4 - 09.8 %   MCV 87.3 80.0 - 100.0 fL   MCH 30.1 26.0 - 34.0 pg   MCHC 34.4 32.0 - 36.0 g/dL   RDW 11.9 14.7 - 82.9 %   Platelets 312 150 - 440 K/uL  Basic metabolic panel  Result Value Ref Range   Sodium 139 135 - 145 mmol/L   Potassium 3.5 3.5 - 5.1 mmol/L   Chloride 107 101 - 111 mmol/L   CO2 27 22 - 32 mmol/L   Glucose, Bld 91 65 - 99 mg/dL   BUN 19 6 - 20 mg/dL   Creatinine, Ser 5.62 0.44 - 1.00 mg/dL   Calcium 8.6 (L) 8.9 - 10.3 mg/dL   GFR calc non Af Amer >60 >60 mL/min   GFR calc Af Amer >60 >60 mL/min   Anion gap 5 5 - 15    Assessment and Plan:   There are no diagnoses linked to this encounter.  . Reviewed expectations re: course of current medical issues. . Discussed self-management of symptoms. . Outlined signs and symptoms indicating need for more acute intervention. . Patient verbalized understanding and all questions were answered. Marland Kitchen Health Maintenance issues including appropriate healthy diet, exercise, and smoking avoidance were discussed with patient. . See orders for this visit as documented in the electronic medical record. . Patient received an After Visit Summary.  Helane Rima, DO [place] 01/12/2018  Future Appointments  Date Time Provider Department Center  01/12/2018  9:00 AM Helane Rima, DO [facility]    *** CMA served as scribe during this visit. History, Physical, and Plan performed by medical provider. The above documentation has been reviewed and is accurate and complete. Helane Rima, D.O.

## 2018-01-15 ENCOUNTER — Encounter: Payer: Self-pay | Admitting: Surgical

## 2018-02-03 ENCOUNTER — Other Ambulatory Visit: Payer: Self-pay | Admitting: Family Medicine

## 2018-02-03 DIAGNOSIS — G4486 Cervicogenic headache: Secondary | ICD-10-CM

## 2018-02-03 DIAGNOSIS — R51 Headache: Principal | ICD-10-CM

## 2018-04-15 NOTE — Progress Notes (Deleted)
Valerie Ford is a 62 y.o. female is here for follow up.  History of Present Illness:   {CMA SCRIBE ATTESTATION}  HPI:   Health Maintenance Due  Topic Date Due  . Hepatitis C Screening  1956-04-04  . HIV Screening  02/18/1970  . TETANUS/TDAP  02/18/1974  . COLON CANCER SCREENING ANNUAL FOBT  02/18/2005  . MAMMOGRAM  03/17/2017  . INFLUENZA VACCINE  02/08/2018  . PAP SMEAR  03/17/2018   Depression screen PHQ 2/9 01/08/2018  Decreased Interest 0  Down, Depressed, Hopeless 0  PHQ - 2 Score 0   PMHx, SurgHx, SocialHx, FamHx, Medications, and Allergies were reviewed in the Visit Navigator and updated as appropriate.   Patient Active Problem List   Diagnosis Date Noted  . Migraine with aura and without status migrainosus, not intractable 01/11/2018  . History of cocaine use, remote, > 20 years 01/11/2018  . Seasonal allergic rhinitis due to pollen 01/11/2018  . Vitamin D deficiency 01/11/2018  . Obesity (BMI 30-39.9), s/p bariatric surgery 01/11/2018  . ADHD, followed by Psych, on low dose Adderall 01/08/2018  . Fatty liver 01/07/2018  . History of bariatric surgery, RNY 01/07/2018  . GERD (gastroesophageal reflux disease) 01/07/2018  . Osteoarthritis of right knee 01/07/2018  . Sacroiliitis (HCC) 01/07/2018  . Obstructive sleep apnea syndrome, not on CPAP since weight loss 10/20/2016   Social History   Tobacco Use  . Smoking status: Former Games developer  . Smokeless tobacco: Never Used  Substance Use Topics  . Alcohol use: No  . Drug use: Not Currently   Current Medications and Allergies:   Current Outpatient Medications:  .  amphetamine-dextroamphetamine (ADDERALL) 10 MG tablet, Take 10 mg by mouth 2 (two) times daily., Disp: , Rfl: 0 .  baclofen (LIORESAL) 10 MG tablet, Take 1 tablet (10 mg total) by mouth 3 (three) times daily., Disp: 30 each, Rfl: 0 .  Butalbital-APAP-Caffeine (FIORICET) 50-300-40 MG CAPS, Take 1 tablet by mouth daily as needed., Disp: 30 capsule,  Rfl: 1 .  celecoxib (CELEBREX) 200 MG capsule, celecoxib 200 mg capsule, Disp: , Rfl:  .  cetirizine (ZYRTEC) 10 MG tablet, Take by mouth., Disp: , Rfl:  .  Cholecalciferol (VITAMIN D3) 2000 units capsule, Take by mouth., Disp: , Rfl:  .  Cyanocobalamin (NASCOBAL) 500 MCG/0.1ML SOLN, Nascobal 500 mcg/spray nasal spray  Spray 1 spray every week by intranasal route for 30 days., Disp: , Rfl:  .  cyanocobalamin (TH VITAMIN B12) 100 MCG tablet, Take by mouth., Disp: , Rfl:  .  furosemide (LASIX) 20 MG tablet, Take 20 mg by mouth daily., Disp: , Rfl:  .  omeprazole (PRILOSEC) 20 MG capsule, Take 20 mg by mouth daily., Disp: , Rfl:  .  ondansetron (ZOFRAN ODT) 4 MG disintegrating tablet, Take 1 tablet (4 mg total) by mouth every 8 (eight) hours as needed for nausea or vomiting., Disp: 20 tablet, Rfl: 0 .  oxyCODONE-acetaminophen (PERCOCET/ROXICET) 5-325 MG tablet, Take 1 tablet by mouth every 4 (four) hours as needed for severe pain., Disp: 30 tablet, Rfl: 0 .  potassium chloride (K-DUR) 10 MEQ tablet, Take 10 mEq by mouth daily., Disp: , Rfl:  .  propranolol (INDERAL) 10 MG tablet, TAKE 1 TABLET BY MOUTH THREE TIMES A DAY, Disp: 90 tablet, Rfl: 0 .  topiramate (TOPAMAX) 25 MG tablet, Take 1 tablet (25 mg total) by mouth 2 (two) times daily., Disp: 30 tablet, Rfl: 3  Allergies  Allergen Reactions  . Sulfa Antibiotics Rash  Other reaction(s): red ears Red and swollen ears Other reaction(s): red ears Other reaction(s): red ears   . Codeine Nausea And Vomiting  . Iodinated Diagnostic Agents Nausea And Vomiting    Other reaction(s): Nausea And Vomiting Patient reports history of nausea and vomiting after IV contast dye. Approximately 35 years ago. approximately 35 years ago. Patient reports history of nausea and vomiting after IV contast dye. Approximately 35 years ago. approximately 35 years ago. Patient reports history of nausea and vomiting after IV contast dye. Approximately 35 years ago.     . Poison Ivy Extract Rash  . Tramadol Nausea And Vomiting    Migraine Migraine Migraine    Review of Systems   Pertinent items are noted in the HPI. Otherwise, ROS is negative.  Vitals:  There were no vitals filed for this visit.   There is no height or weight on file to calculate BMI.  Physical Exam:   Physical Exam  Results for orders placed or performed during the hospital encounter of 10/29/17  CBC  Result Value Ref Range   WBC 8.1 3.6 - 11.0 K/uL   RBC 4.49 3.80 - 5.20 MIL/uL   Hemoglobin 13.5 12.0 - 16.0 g/dL   HCT 72.5 36.6 - 44.0 %   MCV 87.3 80.0 - 100.0 fL   MCH 30.1 26.0 - 34.0 pg   MCHC 34.4 32.0 - 36.0 g/dL   RDW 34.7 42.5 - 95.6 %   Platelets 312 150 - 440 K/uL  Basic metabolic panel  Result Value Ref Range   Sodium 139 135 - 145 mmol/L   Potassium 3.5 3.5 - 5.1 mmol/L   Chloride 107 101 - 111 mmol/L   CO2 27 22 - 32 mmol/L   Glucose, Bld 91 65 - 99 mg/dL   BUN 19 6 - 20 mg/dL   Creatinine, Ser 3.87 0.44 - 1.00 mg/dL   Calcium 8.6 (L) 8.9 - 10.3 mg/dL   GFR calc non Af Amer >60 >60 mL/min   GFR calc Af Amer >60 >60 mL/min   Anion gap 5 5 - 15    Assessment and Plan:   There are no diagnoses linked to this encounter.  . Reviewed expectations re: course of current medical issues. . Discussed self-management of symptoms. . Outlined signs and symptoms indicating need for more acute intervention. . Patient verbalized understanding and all questions were answered. Marland Kitchen Health Maintenance issues including appropriate healthy diet, exercise, and smoking avoidance were discussed with patient. . See orders for this visit as documented in the electronic medical record. . Patient received an After Visit Summary.  *** CMA served as Neurosurgeon during this visit. History, Physical, and Plan performed by medical provider. The above documentation has been reviewed and is accurate and complete. Helane Rima, D.O.  Helane Rima, DO [place] 04/15/2018

## 2018-04-16 ENCOUNTER — Ambulatory Visit: Payer: BLUE CROSS/BLUE SHIELD | Admitting: Family Medicine

## 2018-04-17 ENCOUNTER — Encounter: Payer: Self-pay | Admitting: Family Medicine

## 2018-04-17 ENCOUNTER — Ambulatory Visit: Payer: BLUE CROSS/BLUE SHIELD | Admitting: Family Medicine

## 2018-04-17 VITALS — BP 134/68 | HR 62 | Temp 98.1°F | Ht 63.75 in | Wt 171.4 lb

## 2018-04-17 DIAGNOSIS — G44209 Tension-type headache, unspecified, not intractable: Secondary | ICD-10-CM | POA: Diagnosis not present

## 2018-04-17 DIAGNOSIS — J9801 Acute bronchospasm: Secondary | ICD-10-CM

## 2018-04-17 MED ORDER — HYDROCODONE-HOMATROPINE 5-1.5 MG/5ML PO SYRP: 5.0000 mL | ORAL_SOLUTION | Freq: Every evening | ORAL | 0 refills | Status: DC | PRN

## 2018-04-17 MED ORDER — AZITHROMYCIN 250 MG PO TABS: ORAL_TABLET | ORAL | 0 refills | Status: DC

## 2018-04-17 NOTE — Progress Notes (Signed)
Valerie Ford is a 62 y.o. female is here for follow up.  History of Present Illness:   Barnie Mort, CMA acting as scribe for Dr. Helane Rima.   HPI:   Headaches: She was seen by Neurology. Dx with tension HA, migraine HA, and med rebound HA. Medications reviewed and updated. Improved symptoms but still noting "flashes" of light and pain right side of head. Vision exam normal. Still working on school via computer. Makes worse. Will finish in May.  Cough: She has had productive cough for two weeks. She has not checked but thinks she was running fever last night. She has had congestion no facial pain. No wheeze or edema.  Health Maintenance Due  Topic Date Due  . Hepatitis C Screening  1955-12-08  . HIV Screening  02/18/1970  . TETANUS/TDAP  02/18/1974  . COLON CANCER SCREENING ANNUAL FOBT  02/18/2005  . MAMMOGRAM  03/17/2017  . INFLUENZA VACCINE  02/08/2018  . PAP SMEAR  03/17/2018   Depression screen PHQ 2/9 01/08/2018  Decreased Interest 0  Down, Depressed, Hopeless 0  PHQ - 2 Score 0   PMHx, SurgHx, SocialHx, FamHx, Medications, and Allergies were reviewed in the Visit Navigator and updated as appropriate.   Patient Active Problem List   Diagnosis Date Noted  . Migraine with aura and without status migrainosus, not intractable 01/11/2018  . History of cocaine use, remote, > 20 years 01/11/2018  . Seasonal allergic rhinitis due to pollen 01/11/2018  . Vitamin D deficiency 01/11/2018  . Obesity (BMI 30-39.9), s/p bariatric surgery 01/11/2018  . ADHD, followed by Psych, on low dose Adderall 01/08/2018  . Fatty liver 01/07/2018  . History of bariatric surgery, RNY 01/07/2018  . GERD (gastroesophageal reflux disease) 01/07/2018  . Osteoarthritis of right knee 01/07/2018  . Sacroiliitis (HCC) 01/07/2018  . Obstructive sleep apnea syndrome, not on CPAP since weight loss 10/20/2016   Social History   Tobacco Use  . Smoking status: Former Games developer  . Smokeless  tobacco: Never Used  Substance Use Topics  . Alcohol use: No  . Drug use: Not Currently   Current Medications and Allergies:   .  amphetamine-dextroamphetamine (ADDERALL) 10 MG tablet, Take 10 mg by mouth 2 (two) times daily., Disp: , Rfl: 0 .  celecoxib (CELEBREX) 200 MG capsule, celecoxib 200 mg capsule, Disp: , Rfl:  .  cetirizine (ZYRTEC) 10 MG tablet, Take by mouth., Disp: , Rfl:  .  Cyanocobalamin (NASCOBAL) 500 MCG/0.1ML SOLN, Nascobal 500 mcg/spray nasal spray  Spray 1 spray every week by intranasal route for 30 days., Disp: , Rfl:  .  cyanocobalamin (TH VITAMIN B12) 100 MCG tablet, Take by mouth., Disp: , Rfl:  .  furosemide (LASIX) 20 MG tablet, Take 20 mg by mouth daily., Disp: , Rfl:  .  omeprazole (PRILOSEC) 20 MG capsule, Take 20 mg by mouth daily., Disp: , Rfl:  .  ondansetron (ZOFRAN ODT) 4 MG disintegrating tablet, Take 1 tablet (4 mg total) by mouth every 8 (eight) hours as needed for nausea or vomiting., Disp: 20 tablet, Rfl: 0 .  potassium chloride (K-DUR) 10 MEQ tablet, Take 10 mEq by mouth daily., Disp: , Rfl:  .  tiZANidine (ZANAFLEX) 4 MG tablet, 1 PO QHS and slowly wean up to 3 PO QHS, Disp: , Rfl:  .  verapamil (CALAN-SR) 120 MG CR tablet, 1 PO BID, Disp: , Rfl:  .  azithromycin (ZITHROMAX) 250 MG tablet, Take two tablets first day, then one daily until done.,  Disp: 6 tablet, Rfl: 0 .  Cholecalciferol (VITAMIN D3) 2000 units capsule, Take by mouth., Disp: , Rfl:    Allergies  Allergen Reactions  . Sulfa Antibiotics Rash    Other reaction(s): red ears Red and swollen ears Other reaction(s): red ears Other reaction(s): red ears   . Codeine Nausea And Vomiting  . Iodinated Diagnostic Agents Nausea And Vomiting    Other reaction(s): Nausea And Vomiting Patient reports history of nausea and vomiting after IV contast dye. Approximately 35 years ago. approximately 35 years ago. Patient reports history of nausea and vomiting after IV contast dye. Approximately 35  years ago. approximately 35 years ago. Patient reports history of nausea and vomiting after IV contast dye. Approximately 35 years ago.   . Poison Ivy Extract Rash  . Tramadol Nausea And Vomiting    Migraine Migraine Migraine    Review of Systems   Pertinent items are noted in the HPI. Otherwise, ROS is negative.  Vitals:   Vitals:   04/17/18 1018  BP: 134/68  Pulse: 62  Temp: 98.1 F (36.7 C)  TempSrc: Oral  SpO2: 96%  Weight: 171 lb 6.4 oz (77.7 kg)  Height: 5' 3.75" (1.619 m)     Body mass index is 29.65 kg/m.  Physical Exam:   Physical Exam  Constitutional: She appears well-nourished.  HENT:  Head: Normocephalic and atraumatic.  Eyes: Pupils are equal, round, and reactive to light. EOM are normal.  Neck: Normal range of motion. Neck supple.  Cardiovascular: Normal rate, regular rhythm, normal heart sounds and intact distal pulses.  Pulmonary/Chest: Effort normal.  Abdominal: Soft.  Skin: Skin is warm.  Psychiatric: She has a normal mood and affect. Her behavior is normal.  Nursing note and vitals reviewed.  Assessment and Plan:   [place] was seen today for follow-up.  Diagnoses and all orders for this visit:  Cough due to bronchospasm -     azithromycin (ZITHROMAX) 250 MG tablet; Take two tablets first day, then one daily until done. (SAFETY NET) -     HYDROcodone-homatropine (HYCODAN) 5-1.5 MG/5ML syrup; Take 5 mLs by mouth at bedtime as needed for cough.  Tension headache   . Reviewed expectations re: course of current medical issues. . Discussed self-management of symptoms. . Outlined signs and symptoms indicating need for more acute intervention. . Patient verbalized understanding and all questions were answered. Marland Kitchen Health Maintenance issues including appropriate healthy diet, exercise, and smoking avoidance were discussed with patient. . See orders for this visit as documented in the electronic medical record. . Patient received an After Visit  Summary.  CMA served as Neurosurgeon during this visit. History, Physical, and Plan performed by medical provider. The above documentation has been reviewed and is accurate and complete. Helane Rima, D.O.  Helane Rima, DO [place] 04/17/2018

## 2018-05-03 ENCOUNTER — Other Ambulatory Visit: Payer: Self-pay

## 2018-05-03 ENCOUNTER — Emergency Department
Admission: EM | Admit: 2018-05-03 | Discharge: 2018-05-03 | Disposition: A | Discharge status: Home / Self Care | Payer: BLUE CROSS/BLUE SHIELD | Attending: Student in an Organized Health Care Education/Training Program | Admitting: Student in an Organized Health Care Education/Training Program

## 2018-05-03 ENCOUNTER — Encounter: Payer: Self-pay | Admitting: Emergency Medicine

## 2018-05-03 ENCOUNTER — Emergency Department: Payer: BLUE CROSS/BLUE SHIELD

## 2018-05-03 DIAGNOSIS — Z87891 Personal history of nicotine dependence: Secondary | ICD-10-CM | POA: Insufficient documentation

## 2018-05-03 DIAGNOSIS — R109 Unspecified abdominal pain: Secondary | ICD-10-CM | POA: Insufficient documentation

## 2018-05-03 DIAGNOSIS — Z79899 Other long term (current) drug therapy: Secondary | ICD-10-CM | POA: Insufficient documentation

## 2018-05-03 DIAGNOSIS — N201 Calculus of ureter: Secondary | ICD-10-CM | POA: Diagnosis not present

## 2018-05-03 LAB — URINALYSIS, COMPLETE (UACMP) WITH MICROSCOPIC
Bacteria, UA: NONE SEEN
Bilirubin Urine: NEGATIVE
Glucose, UA: NEGATIVE mg/dL
KETONES UR: 5 mg/dL — AB
Leukocytes, UA: NEGATIVE
Nitrite: NEGATIVE
PH: 5 (ref 5.0–8.0)
PROTEIN: 100 mg/dL — AB
RBC / HPF: >50 RBC/hpf — ABNORMAL HIGH (ref 0–5)
Specific Gravity, Urine: 1.035 — ABNORMAL HIGH (ref 1.005–1.030)

## 2018-05-03 LAB — BASIC METABOLIC PANEL
ANION GAP: 9 (ref 5–15)
BUN: 21 mg/dL (ref 8–23)
CALCIUM: 9.1 mg/dL (ref 8.9–10.3)
CO2: 26 mmol/L (ref 22–32)
Chloride: 108 mmol/L (ref 98–111)
Creatinine, Ser: 0.88 mg/dL (ref 0.44–1.00)
Glucose, Bld: 100 mg/dL — ABNORMAL HIGH (ref 70–99)
POTASSIUM: 4.1 mmol/L (ref 3.5–5.1)
Sodium: 143 mmol/L (ref 135–145)

## 2018-05-03 LAB — CBC
HCT: 41.2 % (ref 36.0–46.0)
Hemoglobin: 13.6 g/dL (ref 12.0–15.0)
MCH: 28.9 pg (ref 26.0–34.0)
MCHC: 33 g/dL (ref 30.0–36.0)
MCV: 87.7 fL (ref 80.0–100.0)
NRBC: 0 % (ref 0.0–0.2)
PLATELETS: 330 10*3/uL (ref 150–400)
RBC: 4.7 MIL/uL (ref 3.87–5.11)
RDW: 11.9 % (ref 11.5–15.5)
WBC: 7.3 10*3/uL (ref 4.0–10.5)

## 2018-05-03 MED ORDER — TAMSULOSIN HCL 0.4 MG PO CAPS
0.4000 mg | ORAL_CAPSULE | Freq: Once | ORAL | Status: DC
  Filled 2018-05-03: qty 1

## 2018-05-03 MED ORDER — FENTANYL CITRATE (PF) 100 MCG/2ML IJ SOLN
INTRAMUSCULAR | Status: AC
  Administered 2018-05-03: 50 ug via NASAL
  Filled 2018-05-03: qty 2

## 2018-05-03 MED ORDER — KETOROLAC TROMETHAMINE 30 MG/ML IJ SOLN
15.0000 mg | Freq: Once | INTRAMUSCULAR | Status: AC
  Administered 2018-05-03: 15 mg via INTRAVENOUS
  Filled 2018-05-03: qty 1

## 2018-05-03 MED ORDER — FENTANYL CITRATE (PF) 100 MCG/2ML IJ SOLN
50.0000 ug | INTRAMUSCULAR | Status: DC | PRN
  Administered 2018-05-03: 50 ug via INTRAVENOUS
  Filled 2018-05-03: qty 2

## 2018-05-03 MED ORDER — HYDROCODONE-ACETAMINOPHEN 5-325 MG PO TABS: 1.0000 | ORAL_TABLET | ORAL | 0 refills | Status: DC | PRN

## 2018-05-03 MED ORDER — FENTANYL CITRATE (PF) 100 MCG/2ML IJ SOLN: 50.0000 ug | INTRAMUSCULAR | Status: DC | PRN

## 2018-05-03 MED ORDER — HYDROCODONE-ACETAMINOPHEN 5-325 MG PO TABS
1.0000 | ORAL_TABLET | Freq: Once | ORAL | Status: AC
  Administered 2018-05-03: 1 via ORAL
  Filled 2018-05-03: qty 1

## 2018-05-03 MED ORDER — ONDANSETRON HCL 4 MG PO TABS: 4.0000 mg | ORAL_TABLET | Freq: Every day | ORAL | 0 refills | Status: DC | PRN

## 2018-05-03 MED ORDER — FENTANYL CITRATE (PF) 100 MCG/2ML IJ SOLN
50.0000 ug | INTRAMUSCULAR | Status: DC | PRN
  Administered 2018-05-03: 50 ug via NASAL
  Filled 2018-05-03: qty 2

## 2018-05-03 MED ORDER — TAMSULOSIN HCL 0.4 MG PO CAPS: 0.4000 mg | ORAL_CAPSULE | Freq: Every day | ORAL | 0 refills | Status: DC

## 2018-05-03 MED ORDER — ONDANSETRON HCL 4 MG/2ML IJ SOLN
4.0000 mg | Freq: Once | INTRAMUSCULAR | Status: AC
  Administered 2018-05-03: 4 mg via INTRAVENOUS

## 2018-05-03 MED ORDER — ONDANSETRON HCL 4 MG/2ML IJ SOLN
INTRAMUSCULAR | Status: AC
  Administered 2018-05-03: 4 mg via INTRAVENOUS
  Filled 2018-05-03: qty 2

## 2018-05-03 NOTE — ED Notes (Signed)
Pt tolerating po's well without difficulty.

## 2018-05-03 NOTE — ED Notes (Signed)
Patient discharged to home per MD order. Patient in stable condition, and deemed medically cleared by ED provider for discharge. Discharge instructions reviewed with patient/family using "Teach Back"; verbalized understanding of medication education and administration, and information about follow-up care. Denies further concerns. ° °

## 2018-05-03 NOTE — Discharge Instructions (Addendum)

## 2018-05-03 NOTE — ED Notes (Signed)
Pt in with co left flank  Pain that started at 1300 today states hx of kidney stones feels the same. Has had urinary burning and hematuria since 1300 denies any fever.

## 2018-05-03 NOTE — ED Triage Notes (Signed)
Pt presents to ED via POV with c/o L flank pain x 3.5 hrs. Pt also c/o N/V since 1330 this afternoon. Pt states L flank pain radiates to back at this time. States hx of kidney stones. Pt also c/o feeling urge to void but feels like she is unable to void.

## 2018-05-03 NOTE — ED Provider Notes (Signed)
[facility] Emergency Department Provider Note    First MD Initiated Contact with Patient 05/03/18 2008     (approximate)  I have reviewed the triage vital signs and the nursing notes.   HISTORY  Chief Complaint Flank Pain    HPI SCHERYL SANBORN is a 62 y.o. female presents the ER for severe left flank pain radiating to the left groin.  States this started suddenly this afternoon.  Does have some nausea with it.  Patient rates the pain is moderate to severe.  Denies any associated fever.  Did start noticing blood in urine starting last night.  Denies any other associated symptoms.  No chest pain or shortness of breath.  No vomiting.  No diarrhea    Past Medical History:  Diagnosis Date  . ADHD 01/08/2018  . Fatty liver 01/07/2018  . GERD (gastroesophageal reflux disease) 01/07/2018  . History of bariatric surgery 01/07/2018  . History of cocaine use, remote, > 20 years 01/11/2018  . Migraine with aura and without status migrainosus, not intractable 01/11/2018  . Obstructive sleep apnea syndrome 10/20/2016  . Osteoarthritis of right knee 01/07/2018  . Sacroiliitis (HCC) 01/07/2018  . Seasonal allergic rhinitis due to pollen 01/11/2018  . Vitamin D deficiency 01/11/2018   Family History  Problem Relation Age of Onset  . Breast cancer Maternal Grandmother 25  . Arthritis Maternal Grandmother   . Asthma Maternal Grandmother   . Depression Maternal Grandmother   . Heart attack Maternal Grandmother   . Arthritis Mother   . Asthma Mother   . COPD Mother   . High Cholesterol Mother   . Hypertension Mother   . Stroke Mother   . Heart disease Father   . Hypertension Father   . Alcohol abuse Sister   . COPD Sister   . Alcohol abuse Brother   . Depression Brother   . Drug abuse Brother    Past Surgical History:  Procedure Laterality Date  . CESAREAN SECTION    . GASTRIC ROUX-EN-Y     Patient Active Problem List   Diagnosis Date Noted  . Migraine with aura  and without status migrainosus, not intractable 01/11/2018  . History of cocaine use, remote, > 20 years 01/11/2018  . Seasonal allergic rhinitis due to pollen 01/11/2018  . Vitamin D deficiency 01/11/2018  . Obesity (BMI 30-39.9), s/p bariatric surgery 01/11/2018  . ADHD, followed by Psych, on low dose Adderall 01/08/2018  . Fatty liver 01/07/2018  . History of bariatric surgery, RNY 01/07/2018  . GERD (gastroesophageal reflux disease) 01/07/2018  . Osteoarthritis of right knee 01/07/2018  . Sacroiliitis (HCC) 01/07/2018  . Obstructive sleep apnea syndrome, not on CPAP since weight loss 10/20/2016      Prior to Admission medications   Medication Sig Start Date End Date Taking? Authorizing Provider  amphetamine-dextroamphetamine (ADDERALL) 10 MG tablet Take 10 mg by mouth 2 (two) times daily. 01/05/18   [provider]  azithromycin (ZITHROMAX) 250 MG tablet Take two tablets first day, then one daily until done. 04/17/18   Helane Rima, DO  celecoxib (CELEBREX) 200 MG capsule celecoxib 200 mg capsule 01/31/17   [provider]  cetirizine (ZYRTEC) 10 MG tablet Take by mouth.    [provider]  Cholecalciferol (VITAMIN D3) 2000 units capsule Take by mouth.    [provider]  Cyanocobalamin (NASCOBAL) 500 MCG/0.1ML SOLN Nascobal 500 mcg/spray nasal spray  Spray 1 spray every week by intranasal route for 30 days.  [provider]  cyanocobalamin (TH VITAMIN B12) 100 MCG tablet Take by mouth.    [provider]  furosemide (LASIX) 20 MG tablet Take 20 mg by mouth daily.    [provider]  HYDROcodone-acetaminophen (NORCO) 5-325 MG tablet Take 1 tablet by mouth every 4 (four) hours as needed for moderate pain. 05/03/18   Willy Eddy, MD  HYDROcodone-homatropine [facility]) 5-1.5 MG/5ML syrup Take 5 mLs by mouth at bedtime as needed for cough. 04/17/18   Helane Rima, DO  omeprazole (PRILOSEC) 20 MG capsule Take 20 mg by  mouth daily.    [provider]  ondansetron (ZOFRAN ODT) 4 MG disintegrating tablet Take 1 tablet (4 mg total) by mouth every 8 (eight) hours as needed for nausea or vomiting. 01/08/18   Helane Rima, DO  ondansetron (ZOFRAN) 4 MG tablet Take 1 tablet (4 mg total) by mouth daily as needed for nausea or vomiting. 05/03/18 05/03/19  Willy Eddy, MD  potassium chloride (K-DUR) 10 MEQ tablet Take 10 mEq by mouth daily.    [provider]  tamsulosin (FLOMAX) 0.4 MG CAPS capsule Take 1 capsule (0.4 mg total) by mouth daily after supper. 05/03/18   Willy Eddy, MD  tiZANidine (ZANAFLEX) 4 MG tablet 1 PO QHS and slowly wean up to 3 PO QHS 01/19/18   [provider]  verapamil (CALAN-SR) 120 MG CR tablet 1 PO BID 01/19/18   [provider]    Allergies Sulfa antibiotics; Codeine; Iodinated diagnostic agents; Poison ivy extract; and Tramadol    Social History Social History   Tobacco Use  . Smoking status: Former Games developer  . Smokeless tobacco: Never Used  Substance Use Topics  . Alcohol use: No  . Drug use: Not Currently    Review of Systems Patient denies headaches, rhinorrhea, blurry vision, numbness, shortness of breath, chest pain, edema, cough, abdominal pain, nausea, vomiting, diarrhea, dysuria, fevers, rashes or hallucinations unless otherwise stated above in HPI. ____________________________________________   PHYSICAL EXAM:  VITAL SIGNS: Vitals:   05/03/18 2200 05/03/18 2230  BP: 139/73 132/74  Pulse: 65 72  Resp:    Temp:    SpO2: 95% 94%    Constitutional: Alert and oriented.  Eyes: Conjunctivae are normal.  Head: Atraumatic. Nose: No congestion/rhinnorhea. Mouth/Throat: Mucous membranes are moist.   Neck: No stridor. Painless ROM.  Cardiovascular: Normal rate, regular rhythm. Grossly normal heart sounds.  Good peripheral circulation. Respiratory: Normal respiratory effort.  No retractions. Lungs CTAB. Gastrointestinal:  Soft and nontender. No distention. No abdominal bruits. +left CVA tenderness. Genitourinary:  Musculoskeletal: No lower extremity tenderness nor edema.  No joint effusions. Neurologic:  Normal speech and language. No gross focal neurologic deficits are appreciated. No facial droop Skin:  Skin is warm, dry and intact. No rash noted. Psychiatric: Mood and affect are normal. Speech and behavior are normal.  ____________________________________________   LABS (all labs ordered are listed, but only abnormal results are displayed)  Results for orders placed or performed during the hospital encounter of 05/03/18 (from the past 24 hour(s))  Urinalysis, Complete w Microscopic     Status: Abnormal   Collection Time: 05/03/18  5:44 PM  Result Value Ref Range   Color, Urine YELLOW (A) YELLOW   APPearance CLOUDY (A) CLEAR   Specific Gravity, Urine 1.035 (H) 1.005 - 1.030   pH 5.0 5.0 - 8.0   Glucose, UA NEGATIVE NEGATIVE mg/dL   Hgb urine dipstick LARGE (A) NEGATIVE   Bilirubin Urine NEGATIVE NEGATIVE   Ketones,  ur 5 (A) NEGATIVE mg/dL   Protein, ur 454 (A) NEGATIVE mg/dL   Nitrite NEGATIVE NEGATIVE   Leukocytes, UA NEGATIVE NEGATIVE   RBC / HPF >50 (H) 0 - 5 RBC/hpf   WBC, UA 0-5 0 - 5 WBC/hpf   Bacteria, UA NONE SEEN NONE SEEN   Squamous Epithelial / LPF 0-5 0 - 5   Mucus PRESENT    Ca Oxalate Crys, UA PRESENT   Basic metabolic panel     Status: Abnormal   Collection Time: 05/03/18  5:44 PM  Result Value Ref Range   Sodium 143 135 - 145 mmol/L   Potassium 4.1 3.5 - 5.1 mmol/L   Chloride 108 98 - 111 mmol/L   CO2 26 22 - 32 mmol/L   Glucose, Bld 100 (H) 70 - 99 mg/dL   BUN 21 8 - 23 mg/dL   Creatinine, Ser 0.98 0.44 - 1.00 mg/dL   Calcium 9.1 8.9 - 11.9 mg/dL   GFR calc non Af Amer >60 >60 mL/min   GFR calc Af Amer >60 >60 mL/min   Anion gap 9 5 - 15  CBC     Status: None   Collection Time: 05/03/18  5:44 PM  Result Value Ref Range   WBC 7.3 4.0 - 10.5 K/uL   RBC 4.70 3.87 -  5.11 MIL/uL   Hemoglobin 13.6 12.0 - 15.0 g/dL   HCT 14.7 82.9 - 56.2 %   MCV 87.7 80.0 - 100.0 fL   MCH 28.9 26.0 - 34.0 pg   MCHC 33.0 30.0 - 36.0 g/dL   RDW 13.0 86.5 - 78.4 %   Platelets 330 150 - 400 K/uL   nRBC 0.0 0.0 - 0.2 %   ____________________________________________ ____________________________________________  RADIOLOGY  I personally reviewed all radiographic images ordered to evaluate for the above acute complaints and reviewed radiology reports and findings.  These findings were personally discussed with the patient.  Please see medical record for radiology report.  ____________________________________________   PROCEDURES  Procedure(s) performed:  Procedures    Critical Care performed: no ____________________________________________   INITIAL IMPRESSION / ASSESSMENT AND PLAN / ED COURSE  Pertinent labs & imaging results that were available during my care of the patient were reviewed by me and considered in my medical decision making (see chart for details).   DDX: Stone, Pilo, diverticulitis, enteritis  TAVARIA MACKINS is a 62 y.o. who presents to the ED with symptoms as described above.  Patient very uncomfortable.  Will be given IV pain medications.  Urinalysis does show evidence of hematuria but I do not see any evidence of infection.  Cytosis or renal dysfunction.  Will order CT imaging to evaluate for the above differential.  Clinical Course as of May 04 36  Thu May 03, 2018  2008 Bilirubin Urine: NEGATIVE [PR]  2143 Patient with 4 mm distal left ureteral stone with associated hydronephrosis.  No evidence of UTI.  No leukocytosis no fever.  Patient's pain controlled after IV Toradol.  Will trial oral pain medication and reassess.   [PR]    Clinical Course User Index [PR] Willy Eddy, MD     As part of my medical decision making, I reviewed the following data within the electronic MEDICAL RECORD NUMBER Nursing notes reviewed and incorporated,  Labs reviewed, notes from prior ED visits.   ____________________________________________   FINAL CLINICAL IMPRESSION(S) / ED DIAGNOSES  Final diagnoses:  Left flank pain  Ureterolithiasis      NEW MEDICATIONS STARTED DURING  THIS VISIT:  Discharge Medication List as of 05/03/2018 10:37 PM    START taking these medications   Details  HYDROcodone-acetaminophen (NORCO) 5-325 MG tablet Take 1 tablet by mouth every 4 (four) hours as needed for moderate pain., Starting Thu 05/03/2018, Print    ondansetron (ZOFRAN) 4 MG tablet Take 1 tablet (4 mg total) by mouth daily as needed for nausea or vomiting., Starting Thu 05/03/2018, Until Fri 05/03/2019, Normal    tamsulosin (FLOMAX) 0.4 MG CAPS capsule Take 1 capsule (0.4 mg total) by mouth daily after supper., Starting Thu 05/03/2018, Normal         Note:  This document was prepared using Dragon voice recognition software and may include unintentional dictation errors.    Willy Eddy, MD 05/04/18 [phone]

## 2018-05-03 NOTE — ED Notes (Signed)
Pt given crackers and soda. 

## 2018-05-07 ENCOUNTER — Telehealth: Payer: Self-pay | Admitting: Family Medicine

## 2018-05-07 DIAGNOSIS — N200 Calculus of kidney: Secondary | ICD-10-CM

## 2018-05-07 NOTE — Addendum Note (Signed)
Addended by: Dorian Pod on: 05/07/2018 02:19 PM   Modules accepted: Orders

## 2018-05-07 NOTE — Telephone Encounter (Signed)
Copied from CRM [id number]. Topic: Quick Communication - See Telephone Encounter >> May 07, 2018  8:31 AM Baldo Daub L wrote: CRM for notification. See Telephone encounter for: 05/07/18.  Pt was seen at [facility] ER on Thursday evening.  DX with kidney stones and told they would refer her to a urologist.  Pt states she still hasn't heard anything.  Pt states she is almost out of pain medications and would like to know if she needs to come in to see Dr. Earlene Plater or if she can be referred by Dr. Earlene Plater to a urologist.  Pt states that she is having a lot of burning and pain - states she has not passed the stone at this time.   Pt can be reached at [phone]

## 2018-05-07 NOTE — Telephone Encounter (Signed)
Do you want to see patient

## 2018-05-07 NOTE — Telephone Encounter (Signed)
Patient has been notified that referral has been placed.

## 2018-05-07 NOTE — Telephone Encounter (Signed)
Urgent referral to Urology since she is still having so much trouble.

## 2018-05-08 NOTE — Telephone Encounter (Signed)
Please check with office to see when patient will be contacted and call to update.

## 2018-05-08 NOTE — Telephone Encounter (Signed)
Patient calling and states that she has still not heard from the urology office. States that if she has not heard anything from the today or tomorrow, she would like to go with someone else. Please advise.

## 2018-05-10 ENCOUNTER — Encounter: Payer: Self-pay | Admitting: Urology

## 2018-05-10 ENCOUNTER — Ambulatory Visit (INDEPENDENT_AMBULATORY_CARE_PROVIDER_SITE_OTHER): Payer: BLUE CROSS/BLUE SHIELD | Admitting: Urology

## 2018-05-10 ENCOUNTER — Ambulatory Visit
Admission: RE | Admit: 2018-05-10 | Discharge: 2018-05-10 | Disposition: A | Discharge status: Home / Self Care | Payer: BLUE CROSS/BLUE SHIELD | Source: Ambulatory Visit | Attending: Urology | Admitting: Urology

## 2018-05-10 VITALS — BP 161/92 | HR 69 | Ht 63.5 in | Wt 171.0 lb

## 2018-05-10 DIAGNOSIS — N201 Calculus of ureter: Secondary | ICD-10-CM | POA: Diagnosis not present

## 2018-05-10 DIAGNOSIS — N23 Unspecified renal colic: Secondary | ICD-10-CM | POA: Diagnosis not present

## 2018-05-10 LAB — MICROSCOPIC EXAMINATION: EPITHELIAL CELLS (NON RENAL): NONE SEEN /HPF (ref 0–10)

## 2018-05-10 LAB — URINALYSIS, COMPLETE
BILIRUBIN UA: NEGATIVE
Glucose, UA: NEGATIVE
KETONES UA: NEGATIVE
LEUKOCYTES UA: NEGATIVE
Nitrite, UA: NEGATIVE
PH UA: 6 (ref 5.0–7.5)
PROTEIN UA: NEGATIVE
SPEC GRAV UA: 1.02 (ref 1.005–1.030)
UUROB: 0.2 mg/dL (ref 0.2–1.0)

## 2018-05-10 LAB — BLADDER SCAN AMB NON-IMAGING

## 2018-05-10 NOTE — H&P (View-Only) (Signed)
05/10/2018 1:49 PM   Debroah Loop Mar 09, 1956 [id number]  Referring provider: Helane Rima, DO [address] [place], [place] [address]  Chief Complaint  Patient presents with  . Nephrolithiasis    HPI: 62 year old female presented to the ED on 05/03/2018 with acute onset of severe left flank pain which radiated to the left groin region.  There were no identifiable precipitating, aggravating or alleviating factors.  She had nausea without vomiting.  Denied fever or chills.  She did note gross hematuria one day prior to onset of her pain.  A stone protocol CT was performed which showed a 4 mm left mid ureteral calculus with mild hydronephrosis/hydroureter.  She had one previous stone approximately 20 years ago which required endoscopic removal.  Her most bothersome symptoms at present are urinary frequency, urgency, voiding small amounts and sensation of incomplete emptying.  She is on tamsulosin and hydrocodone.   PMH: Past Medical History:  Diagnosis Date  . ADHD 01/08/2018  . Fatty liver 01/07/2018  . GERD (gastroesophageal reflux disease) 01/07/2018  . History of bariatric surgery 01/07/2018  . History of cocaine use, remote, > 20 years 01/11/2018  . Migraine with aura and without status migrainosus, not intractable 01/11/2018  . Obstructive sleep apnea syndrome 10/20/2016  . Osteoarthritis of right knee 01/07/2018  . Sacroiliitis (HCC) 01/07/2018  . Seasonal allergic rhinitis due to pollen 01/11/2018  . Vitamin D deficiency 01/11/2018    Surgical History: Past Surgical History:  Procedure Laterality Date  . CESAREAN SECTION    . GASTRIC ROUX-EN-Y      Home Medications:  Allergies as of 05/10/2018      Reactions   Sulfa Antibiotics Rash   Other reaction(s): red ears Red and swollen ears Other reaction(s): red ears Other reaction(s): red ears   Codeine Nausea And Vomiting   Iodinated Diagnostic Agents Nausea And Vomiting   Other reaction(s): Nausea And  Vomiting Patient reports history of nausea and vomiting after IV contast dye. Approximately 35 years ago. approximately 35 years ago. Patient reports history of nausea and vomiting after IV contast dye. Approximately 35 years ago. approximately 35 years ago. Patient reports history of nausea and vomiting after IV contast dye. Approximately 35 years ago.   Poison Ivy Extract Rash   Tramadol Nausea And Vomiting   Migraine Migraine Migraine      Medication List        Accurate as of 05/10/18  1:49 PM. Always use your most recent med list.          amphetamine-dextroamphetamine 10 MG tablet Commonly known as:  ADDERALL Take 10 mg by mouth 2 (two) times daily.   azithromycin 250 MG tablet Commonly known as:  ZITHROMAX Take two tablets first day, then one daily until done.   celecoxib 200 MG capsule Commonly known as:  CELEBREX celecoxib 200 mg capsule   cetirizine 10 MG tablet Commonly known as:  ZYRTEC Take by mouth.   fexofenadine 180 MG tablet Commonly known as:  ALLEGRA TAKE 1 TABLET BY MOUTH EVERY DAY FOR ALLERGIES   furosemide 20 MG tablet Commonly known as:  LASIX Take 20 mg by mouth daily.   HYDROcodone-acetaminophen 5-325 MG tablet Commonly known as:  NORCO/VICODIN Take 1 tablet by mouth every 4 (four) hours as needed for moderate pain.   HYDROcodone-homatropine 5-1.5 MG/5ML syrup Commonly known as:  HYCODAN Take 5 mLs by mouth at bedtime as needed for cough.   omeprazole 20 MG capsule Commonly known as:  PRILOSEC Take  20 mg by mouth daily.   ondansetron 4 MG disintegrating tablet Commonly known as:  ZOFRAN-ODT Take 1 tablet (4 mg total) by mouth every 8 (eight) hours as needed for nausea or vomiting.   ondansetron 4 MG tablet Commonly known as:  ZOFRAN Take 1 tablet (4 mg total) by mouth daily as needed for nausea or vomiting.   potassium chloride 10 MEQ tablet Commonly known as:  K-DUR Take 10 mEq by mouth daily.   tamsulosin 0.4 MG Caps  capsule Commonly known as:  FLOMAX Take 1 capsule (0.4 mg total) by mouth daily after supper.   TH VITAMIN B12 100 MCG tablet Generic drug:  cyanocobalamin Take by mouth.   NASCOBAL 500 MCG/0.1ML Soln Generic drug:  Cyanocobalamin Nascobal 500 mcg/spray nasal spray  Spray 1 spray every week by intranasal route for 30 days.   tiZANidine 4 MG tablet Commonly known as:  ZANAFLEX 1 PO QHS and slowly wean up to 3 PO QHS   verapamil 120 MG CR tablet Commonly known as:  CALAN-SR 1 PO BID   Vitamin D3 2000 units capsule Take by mouth.       Allergies:  Allergies  Allergen Reactions  . Sulfa Antibiotics Rash    Other reaction(s): red ears Red and swollen ears Other reaction(s): red ears Other reaction(s): red ears   . Codeine Nausea And Vomiting  . Iodinated Diagnostic Agents Nausea And Vomiting    Other reaction(s): Nausea And Vomiting Patient reports history of nausea and vomiting after IV contast dye. Approximately 35 years ago. approximately 35 years ago. Patient reports history of nausea and vomiting after IV contast dye. Approximately 35 years ago. approximately 35 years ago. Patient reports history of nausea and vomiting after IV contast dye. Approximately 35 years ago.   . Poison Ivy Extract Rash  . Tramadol Nausea And Vomiting    Migraine Migraine Migraine     Family History: Family History  Problem Relation Age of Onset  . Breast cancer Maternal Grandmother 65  . Arthritis Maternal Grandmother   . Asthma Maternal Grandmother   . Depression Maternal Grandmother   . Heart attack Maternal Grandmother   . Arthritis Mother   . Asthma Mother   . COPD Mother   . High Cholesterol Mother   . Hypertension Mother   . Stroke Mother   . Heart disease Father   . Hypertension Father   . Alcohol abuse Sister   . COPD Sister   . Alcohol abuse Brother   . Depression Brother   . Drug abuse Brother     Social History:  reports that she has quit smoking. She  has never used smokeless tobacco. She reports that she has current or past drug history. She reports that she does not drink alcohol.  ROS: UROLOGY Frequent Urination?: Yes Hard to postpone urination?: Yes Burning/pain with urination?: Yes Get up at night to urinate?: Yes Leakage of urine?: No Urine stream starts and stops?: No Trouble starting stream?: No Do you have to strain to urinate?: Yes Blood in urine?: No Urinary tract infection?: No Sexually transmitted disease?: No Injury to kidneys or bladder?: No Painful intercourse?: No Weak stream?: No Currently pregnant?: No Vaginal bleeding?: No Last menstrual period?: Postmenopausal  Gastrointestinal Nausea?: Yes Vomiting?: Yes Indigestion/heartburn?: No Diarrhea?: No Constipation?: No  Constitutional Fever: No Night sweats?: No Weight loss?: No Fatigue?: No  Skin Skin rash/lesions?: No Itching?: No  Eyes Blurred vision?: Yes Double vision?: No  Ears/Nose/Throat Sore throat?: No Sinus problems?:  No  Hematologic/Lymphatic Swollen glands?: No Easy bruising?: No  Cardiovascular Leg swelling?: No Chest pain?: No  Respiratory Cough?: No Shortness of breath?: No  Endocrine Excessive thirst?: No  Musculoskeletal Back pain?: Yes Joint pain?: No  Neurological Headaches?: No Dizziness?: No  Psychologic Depression?: No Anxiety?: No  Physical Exam: BP (!) 161/92 (BP Location: Left Arm, Patient Position: Sitting, Cuff Size: Normal)   Pulse 69   Ht 5' 3.5" (1.613 m)   Wt 171 lb (77.6 kg)   LMP 03/17/2005 (Approximate)   BMI 29.82 kg/m   Constitutional:  Alert and oriented, No acute distress. HEENT: [place] AT, moist mucus membranes.  Trachea midline, no masses. Cardiovascular: No clubbing, cyanosis, or edema.  RRR Respiratory: Normal respiratory effort, no increased work of breathing.  Clear GI: Abdomen is soft, nontender, nondistended, no abdominal masses GU: No CVA tenderness Lymph: No cervical  or inguinal lymphadenopathy. Skin: No rashes, bruises or suspicious lesions. Neurologic: Grossly intact, no focal deficits, moving all 4 extremities. Psychiatric: Normal mood and affect.  Laboratory Data:  Urinalysis Dipstick trace blood Microscopy 3-10 RBC  Pertinent Imaging: Images were personally reviewed Results for orders placed during the hospital encounter of 05/03/18  CT Renal Stone Study   Narrative CLINICAL DATA:  Left flank pain with nausea and vomiting  EXAM: CT ABDOMEN AND PELVIS WITHOUT CONTRAST  TECHNIQUE: Multidetector CT imaging of the abdomen and pelvis was performed following the standard protocol without IV contrast.  COMPARISON:  None.  FINDINGS: Lower chest: Lung bases demonstrate no acute consolidation or pleural effusion. Normal heart size.  Hepatobiliary: No focal liver abnormality is seen. No gallstones, gallbladder wall thickening, or biliary dilatation.  Pancreas: Unremarkable. No pancreatic ductal dilatation or surrounding inflammatory changes.  Spleen: Normal in size without focal abnormality.  Adrenals/Urinary Tract: Right adrenal gland is normal. 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 No right hydronephrosis. Moderate left hydronephrosis and hydroureter, secondary to a 4 mm stone in the mid to distal left ureter at the level of the upper sacrum. Urinary bladder is normal  Stomach/Bowel: Postsurgical changes of prior gastric bypass surgery. Mild air distended bowel in the left upper quadrant but without obstruction. Negative appendix. No colon wall thickening.  Vascular/Lymphatic: Mild aortic atherosclerosis. No aneurysm. No significantly enlarged lymph nodes.  Reproductive: Uterus and bilateral adnexa are unremarkable.  Other: Negative for free air or free fluid. Small fat in the umbilical region.  Musculoskeletal: No acute or significant osseous findings.  IMPRESSION: 1. Moderate left hydronephrosis and  hydroureter, secondary to a 4 mm stone in the mid to distal left ureter at the level of the upper sacrum. 2. 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 Findings consistent with prior gastric bypass surgery without evidence for a bowel obstruction   Electronically Signed   By: Jasmine Pang M.D.   On: 05/03/2018 20:55     Assessment & Plan:   62 year old female with left renal colic secondary to 4 mm obstructing mid ureteral calculus.  She is having storage related voiding symptoms and may have migration of the stone to the distal ureter.  A KUB was ordered.  PVR by bladder scan was 20 mL.  She was given samples of Myrbetriq 25 mg daily for her bladder symptoms.  If she is unable to pass we discussed both ureteroscopic removal and shockwave lithotripsy.  The pros and cons of each treatment were discussed.  She will be notified with her KUB results.      C  , MD  [facility] [address], [address] [place], [place] [address] [phone]  

## 2018-05-10 NOTE — Progress Notes (Signed)
 05/10/2018 1:49 PM   Tawanna D Wessell 02/08/1956 [id number]  Referring provider: Wallace, Erica, DO [address] [place], [place] [address]  Chief Complaint  Patient presents with  . Nephrolithiasis    HPI: 62-year-old female presented to the ED on 05/03/2018 with acute onset of severe left flank pain which radiated to the left groin region.  There were no identifiable precipitating, aggravating or alleviating factors.  She had nausea without vomiting.  Denied fever or chills.  She did note gross hematuria one day prior to onset of her pain.  A stone protocol CT was performed which showed a 4 mm left mid ureteral calculus with mild hydronephrosis/hydroureter.  She had one previous stone approximately 20 years ago which required endoscopic removal.  Her most bothersome symptoms at present are urinary frequency, urgency, voiding small amounts and sensation of incomplete emptying.  She is on tamsulosin and hydrocodone.   PMH: Past Medical History:  Diagnosis Date  . ADHD 01/08/2018  . Fatty liver 01/07/2018  . GERD (gastroesophageal reflux disease) 01/07/2018  . History of bariatric surgery 01/07/2018  . History of cocaine use, remote, > 20 years 01/11/2018  . Migraine with aura and without status migrainosus, not intractable 01/11/2018  . Obstructive sleep apnea syndrome 10/20/2016  . Osteoarthritis of right knee 01/07/2018  . Sacroiliitis (HCC) 01/07/2018  . Seasonal allergic rhinitis due to pollen 01/11/2018  . Vitamin D deficiency 01/11/2018    Surgical History: Past Surgical History:  Procedure Laterality Date  . CESAREAN SECTION    . GASTRIC ROUX-EN-Y      Home Medications:  Allergies as of 05/10/2018      Reactions   Sulfa Antibiotics Rash   Other reaction(s): red ears Red and swollen ears Other reaction(s): red ears Other reaction(s): red ears   Codeine Nausea And Vomiting   Iodinated Diagnostic Agents Nausea And Vomiting   Other reaction(s): Nausea And  Vomiting Patient reports history of nausea and vomiting after IV contast dye. Approximately 35 years ago. approximately 35 years ago. Patient reports history of nausea and vomiting after IV contast dye. Approximately 35 years ago. approximately 35 years ago. Patient reports history of nausea and vomiting after IV contast dye. Approximately 35 years ago.   Poison Ivy Extract Rash   Tramadol Nausea And Vomiting   Migraine Migraine Migraine      Medication List        Accurate as of 05/10/18  1:49 PM. Always use your most recent med list.          amphetamine-dextroamphetamine 10 MG tablet Commonly known as:  ADDERALL Take 10 mg by mouth 2 (two) times daily.   azithromycin 250 MG tablet Commonly known as:  ZITHROMAX Take two tablets first day, then one daily until done.   celecoxib 200 MG capsule Commonly known as:  CELEBREX celecoxib 200 mg capsule   cetirizine 10 MG tablet Commonly known as:  ZYRTEC Take by mouth.   fexofenadine 180 MG tablet Commonly known as:  ALLEGRA TAKE 1 TABLET BY MOUTH EVERY DAY FOR ALLERGIES   furosemide 20 MG tablet Commonly known as:  LASIX Take 20 mg by mouth daily.   HYDROcodone-acetaminophen 5-325 MG tablet Commonly known as:  NORCO/VICODIN Take 1 tablet by mouth every 4 (four) hours as needed for moderate pain.   HYDROcodone-homatropine 5-1.5 MG/5ML syrup Commonly known as:  HYCODAN Take 5 mLs by mouth at bedtime as needed for cough.   omeprazole 20 MG capsule Commonly known as:  PRILOSEC Take   20 mg by mouth daily.   ondansetron 4 MG disintegrating tablet Commonly known as:  ZOFRAN-ODT Take 1 tablet (4 mg total) by mouth every 8 (eight) hours as needed for nausea or vomiting.   ondansetron 4 MG tablet Commonly known as:  ZOFRAN Take 1 tablet (4 mg total) by mouth daily as needed for nausea or vomiting.   potassium chloride 10 MEQ tablet Commonly known as:  K-DUR Take 10 mEq by mouth daily.   tamsulosin 0.4 MG Caps  capsule Commonly known as:  FLOMAX Take 1 capsule (0.4 mg total) by mouth daily after supper.   TH VITAMIN B12 100 MCG tablet Generic drug:  cyanocobalamin Take by mouth.   NASCOBAL 500 MCG/0.1ML Soln Generic drug:  Cyanocobalamin Nascobal 500 mcg/spray nasal spray  Spray 1 spray every week by intranasal route for 30 days.   tiZANidine 4 MG tablet Commonly known as:  ZANAFLEX 1 PO QHS and slowly wean up to 3 PO QHS   verapamil 120 MG CR tablet Commonly known as:  CALAN-SR 1 PO BID   Vitamin D3 2000 units capsule Take by mouth.       Allergies:  Allergies  Allergen Reactions  . Sulfa Antibiotics Rash    Other reaction(s): red ears Red and swollen ears Other reaction(s): red ears Other reaction(s): red ears   . Codeine Nausea And Vomiting  . Iodinated Diagnostic Agents Nausea And Vomiting    Other reaction(s): Nausea And Vomiting Patient reports history of nausea and vomiting after IV contast dye. Approximately 35 years ago. approximately 35 years ago. Patient reports history of nausea and vomiting after IV contast dye. Approximately 35 years ago. approximately 35 years ago. Patient reports history of nausea and vomiting after IV contast dye. Approximately 35 years ago.   . Poison Ivy Extract Rash  . Tramadol Nausea And Vomiting    Migraine Migraine Migraine     Family History: Family History  Problem Relation Age of Onset  . Breast cancer Maternal Grandmother 55  . Arthritis Maternal Grandmother   . Asthma Maternal Grandmother   . Depression Maternal Grandmother   . Heart attack Maternal Grandmother   . Arthritis Mother   . Asthma Mother   . COPD Mother   . High Cholesterol Mother   . Hypertension Mother   . Stroke Mother   . Heart disease Father   . Hypertension Father   . Alcohol abuse Sister   . COPD Sister   . Alcohol abuse Brother   . Depression Brother   . Drug abuse Brother     Social History:  reports that she has quit smoking. She  has never used smokeless tobacco. She reports that she has current or past drug history. She reports that she does not drink alcohol.  ROS: UROLOGY Frequent Urination?: Yes Hard to postpone urination?: Yes Burning/pain with urination?: Yes Get up at night to urinate?: Yes Leakage of urine?: No Urine stream starts and stops?: No Trouble starting stream?: No Do you have to strain to urinate?: Yes Blood in urine?: No Urinary tract infection?: No Sexually transmitted disease?: No Injury to kidneys or bladder?: No Painful intercourse?: No Weak stream?: No Currently pregnant?: No Vaginal bleeding?: No Last menstrual period?: Postmenopausal  Gastrointestinal Nausea?: Yes Vomiting?: Yes Indigestion/heartburn?: No Diarrhea?: No Constipation?: No  Constitutional Fever: No Night sweats?: No Weight loss?: No Fatigue?: No  Skin Skin rash/lesions?: No Itching?: No  Eyes Blurred vision?: Yes Double vision?: No  Ears/Nose/Throat Sore throat?: No Sinus problems?:   No  Hematologic/Lymphatic Swollen glands?: No Easy bruising?: No  Cardiovascular Leg swelling?: No Chest pain?: No  Respiratory Cough?: No Shortness of breath?: No  Endocrine Excessive thirst?: No  Musculoskeletal Back pain?: Yes Joint pain?: No  Neurological Headaches?: No Dizziness?: No  Psychologic Depression?: No Anxiety?: No  Physical Exam: BP (!) 161/92 (BP Location: Left Arm, Patient Position: Sitting, Cuff Size: Normal)   Pulse 69   Ht 5' 3.5" (1.613 m)   Wt 171 lb (77.6 kg)   LMP 03/17/2005 (Approximate)   BMI 29.82 kg/m   Constitutional:  Alert and oriented, No acute distress. HEENT: [place] AT, moist mucus membranes.  Trachea midline, no masses. Cardiovascular: No clubbing, cyanosis, or edema.  RRR Respiratory: Normal respiratory effort, no increased work of breathing.  Clear GI: Abdomen is soft, nontender, nondistended, no abdominal masses GU: No CVA tenderness Lymph: No cervical  or inguinal lymphadenopathy. Skin: No rashes, bruises or suspicious lesions. Neurologic: Grossly intact, no focal deficits, moving all 4 extremities. Psychiatric: Normal mood and affect.  Laboratory Data:  Urinalysis Dipstick trace blood Microscopy 3-10 RBC  Pertinent Imaging: Images were personally reviewed Results for orders placed during the hospital encounter of 05/03/18  CT Renal Stone Study   Narrative CLINICAL DATA:  Left flank pain with nausea and vomiting  EXAM: CT ABDOMEN AND PELVIS WITHOUT CONTRAST  TECHNIQUE: Multidetector CT imaging of the abdomen and pelvis was performed following the standard protocol without IV contrast.  COMPARISON:  None.  FINDINGS: Lower chest: Lung bases demonstrate no acute consolidation or pleural effusion. Normal heart size.  Hepatobiliary: No focal liver abnormality is seen. No gallstones, gallbladder wall thickening, or biliary dilatation.  Pancreas: Unremarkable. No pancreatic ductal dilatation or surrounding inflammatory changes.  Spleen: Normal in size without focal abnormality.  Adrenals/Urinary Tract: Right adrenal gland is normal. 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 No right hydronephrosis. Moderate left hydronephrosis and hydroureter, secondary to a 4 mm stone in the mid to distal left ureter at the level of the upper sacrum. Urinary bladder is normal  Stomach/Bowel: Postsurgical changes of prior gastric bypass surgery. Mild air distended bowel in the left upper quadrant but without obstruction. Negative appendix. No colon wall thickening.  Vascular/Lymphatic: Mild aortic atherosclerosis. No aneurysm. No significantly enlarged lymph nodes.  Reproductive: Uterus and bilateral adnexa are unremarkable.  Other: Negative for free air or free fluid. Small fat in the umbilical region.  Musculoskeletal: No acute or significant osseous findings.  IMPRESSION: 1. Moderate left hydronephrosis and  hydroureter, secondary to a 4 mm stone in the mid to distal left ureter at the level of the upper sacrum. 2. 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 Findings consistent with prior gastric bypass surgery without evidence for a bowel obstruction   Electronically Signed   By: Kim  Fujinaga M.D.   On: 05/03/2018 20:55     Assessment & Plan:   62-year-old female with left renal colic secondary to 4 mm obstructing mid ureteral calculus.  She is having storage related voiding symptoms and may have migration of the stone to the distal ureter.  A KUB was ordered.  PVR by bladder scan was 20 mL.  She was given samples of Myrbetriq 25 mg daily for her bladder symptoms.  If she is unable to pass we discussed both ureteroscopic removal and shockwave lithotripsy.  The pros and cons of each treatment were discussed.  She will be notified with her KUB results.     Drayce Tawil C   Columbia Pandey, MD  [facility] [address], [address] [place], [place] [address] [phone]  

## 2018-05-11 ENCOUNTER — Telehealth: Payer: Self-pay

## 2018-05-11 MED ORDER — HYDROCODONE-ACETAMINOPHEN 5-325 MG PO TABS: 1.0000 | ORAL_TABLET | ORAL | 0 refills | Status: DC | PRN

## 2018-05-11 NOTE — Telephone Encounter (Signed)
Patient calls and states that she has been in severe pain beginning approx 1am today. Pt states that she is out of the pain medication she was given in the ED. Please advise.

## 2018-05-11 NOTE — Telephone Encounter (Signed)
Rx sent.  Recommend to ED if pain not controlled.

## 2018-05-14 ENCOUNTER — Other Ambulatory Visit: Payer: Self-pay | Admitting: Radiology

## 2018-05-14 ENCOUNTER — Telehealth: Payer: Self-pay | Admitting: Radiology

## 2018-05-14 ENCOUNTER — Telehealth: Payer: Self-pay | Admitting: Urology

## 2018-05-14 DIAGNOSIS — N201 Calculus of ureter: Secondary | ICD-10-CM

## 2018-05-14 MED ORDER — CEFAZOLIN SODIUM-DEXTROSE 2-4 GM/100ML-% IV SOLN
2.0000 g | INTRAVENOUS | Status: AC
  Administered 2018-05-15: 2 g via INTRAVENOUS

## 2018-05-14 NOTE — Telephone Encounter (Signed)
Amy can we do her tomorrow-left ureteroscopy/laser/stent?

## 2018-05-14 NOTE — Telephone Encounter (Signed)
Called to notify patient of left ureteroscopy, laser lithotripsy, stone removal & left ureteral stent placement scheduled 05/15/2018. Advised patient to arrive at [facility] Registration Desk on the 1st floor at 8:15 & once registered go to Same Day Surgery on the 2nd floor of the [facility].  Advised patient not to eat or drink anything after midnight tonight except to take prilosec with a sip of water tomorrow prior to arrival for surgery. Also advised patient not to use deodorant, lotions, or perfumes on the morning of surgery. Advised patient that she will need a driver as she will not be allowed to drive herself home after the surgery. Questions answered. Patient voices understanding.

## 2018-05-14 NOTE — Telephone Encounter (Signed)
Patient said she would like to proceed with surgery if we could get that set up for her.  Marcelino Duster

## 2018-05-15 ENCOUNTER — Encounter: Payer: Self-pay | Admitting: *Deleted

## 2018-05-15 ENCOUNTER — Ambulatory Visit: Payer: BLUE CROSS/BLUE SHIELD | Admitting: Anesthesiology

## 2018-05-15 ENCOUNTER — Ambulatory Visit
Admission: RE | Admit: 2018-05-15 | Discharge: 2018-05-15 | Disposition: A | Discharge status: Home / Self Care | Payer: BLUE CROSS/BLUE SHIELD | Source: Ambulatory Visit | Attending: Urology | Admitting: Urology

## 2018-05-15 ENCOUNTER — Other Ambulatory Visit: Payer: Self-pay

## 2018-05-15 ENCOUNTER — Encounter
Admission: RE | Disposition: A | Discharge status: Home / Self Care | Payer: Self-pay | Source: Ambulatory Visit | Attending: Urology

## 2018-05-15 DIAGNOSIS — F909 Attention-deficit hyperactivity disorder, unspecified type: Secondary | ICD-10-CM | POA: Insufficient documentation

## 2018-05-15 DIAGNOSIS — K760 Fatty (change of) liver, not elsewhere classified: Secondary | ICD-10-CM | POA: Diagnosis not present

## 2018-05-15 DIAGNOSIS — G4733 Obstructive sleep apnea (adult) (pediatric): Secondary | ICD-10-CM | POA: Insufficient documentation

## 2018-05-15 DIAGNOSIS — Z91041 Radiographic dye allergy status: Secondary | ICD-10-CM | POA: Diagnosis not present

## 2018-05-15 DIAGNOSIS — Z888 Allergy status to other drugs, medicaments and biological substances status: Secondary | ICD-10-CM | POA: Diagnosis not present

## 2018-05-15 DIAGNOSIS — G43909 Migraine, unspecified, not intractable, without status migrainosus: Secondary | ICD-10-CM | POA: Diagnosis not present

## 2018-05-15 DIAGNOSIS — N132 Hydronephrosis with renal and ureteral calculous obstruction: Secondary | ICD-10-CM | POA: Diagnosis present

## 2018-05-15 DIAGNOSIS — Z79899 Other long term (current) drug therapy: Secondary | ICD-10-CM | POA: Diagnosis not present

## 2018-05-15 DIAGNOSIS — Z882 Allergy status to sulfonamides status: Secondary | ICD-10-CM | POA: Insufficient documentation

## 2018-05-15 DIAGNOSIS — Z87442 Personal history of urinary calculi: Secondary | ICD-10-CM | POA: Diagnosis not present

## 2018-05-15 DIAGNOSIS — M1711 Unilateral primary osteoarthritis, right knee: Secondary | ICD-10-CM | POA: Insufficient documentation

## 2018-05-15 DIAGNOSIS — R110 Nausea: Secondary | ICD-10-CM | POA: Insufficient documentation

## 2018-05-15 DIAGNOSIS — Z9884 Bariatric surgery status: Secondary | ICD-10-CM | POA: Diagnosis not present

## 2018-05-15 DIAGNOSIS — E559 Vitamin D deficiency, unspecified: Secondary | ICD-10-CM | POA: Diagnosis not present

## 2018-05-15 DIAGNOSIS — Z9109 Other allergy status, other than to drugs and biological substances: Secondary | ICD-10-CM | POA: Diagnosis not present

## 2018-05-15 DIAGNOSIS — N201 Calculus of ureter: Secondary | ICD-10-CM

## 2018-05-15 DIAGNOSIS — K219 Gastro-esophageal reflux disease without esophagitis: Secondary | ICD-10-CM | POA: Diagnosis not present

## 2018-05-15 DIAGNOSIS — M461 Sacroiliitis, not elsewhere classified: Secondary | ICD-10-CM | POA: Diagnosis not present

## 2018-05-15 DIAGNOSIS — Z87891 Personal history of nicotine dependence: Secondary | ICD-10-CM | POA: Diagnosis not present

## 2018-05-15 DIAGNOSIS — E279 Disorder of adrenal gland, unspecified: Secondary | ICD-10-CM | POA: Diagnosis not present

## 2018-05-15 DIAGNOSIS — Z885 Allergy status to narcotic agent status: Secondary | ICD-10-CM | POA: Insufficient documentation

## 2018-05-15 HISTORY — PX: CYSTOSCOPY/URETEROSCOPY/HOLMIUM LASER/STENT PLACEMENT: SHX6546

## 2018-05-15 LAB — URINE DRUG SCREEN, QUALITATIVE (ARMC ONLY)
Amphetamines, Ur Screen: NOT DETECTED
BARBITURATES, UR SCREEN: NOT DETECTED
BENZODIAZEPINE, UR SCRN: NOT DETECTED
COCAINE METABOLITE, UR ~~LOC~~: NOT DETECTED
Cannabinoid 50 Ng, Ur ~~LOC~~: NOT DETECTED
MDMA (ECSTASY) UR SCREEN: NOT DETECTED
Methadone Scn, Ur: NOT DETECTED
OPIATE, UR SCREEN: POSITIVE — AB
PHENCYCLIDINE (PCP) UR S: NOT DETECTED
TRICYCLIC, UR SCREEN: NOT DETECTED

## 2018-05-15 SURGERY — CYSTOSCOPY/URETEROSCOPY/HOLMIUM LASER/STENT PLACEMENT
Anesthesia: General | Laterality: Left

## 2018-05-15 MED ORDER — PROPOFOL 10 MG/ML IV BOLUS
INTRAVENOUS | Status: AC
  Filled 2018-05-15: qty 20

## 2018-05-15 MED ORDER — PROMETHAZINE HCL 25 MG/ML IJ SOLN
6.2500 mg | INTRAMUSCULAR | Status: DC | PRN
  Administered 2018-05-15: 6.25 mg via INTRAVENOUS

## 2018-05-15 MED ORDER — ONDANSETRON HCL 4 MG/2ML IJ SOLN
INTRAMUSCULAR | Status: AC
  Filled 2018-05-15: qty 2

## 2018-05-15 MED ORDER — FENTANYL CITRATE (PF) 100 MCG/2ML IJ SOLN
INTRAMUSCULAR | Status: AC
  Filled 2018-05-15: qty 2

## 2018-05-15 MED ORDER — FENTANYL CITRATE (PF) 100 MCG/2ML IJ SOLN
25.0000 ug | INTRAMUSCULAR | Status: DC | PRN
  Administered 2018-05-15: 50 ug via INTRAVENOUS

## 2018-05-15 MED ORDER — MIDAZOLAM HCL 2 MG/2ML IJ SOLN
INTRAMUSCULAR | Status: AC
  Filled 2018-05-15: qty 2

## 2018-05-15 MED ORDER — ROCURONIUM BROMIDE 50 MG/5ML IV SOLN
INTRAVENOUS | Status: AC
  Filled 2018-05-15: qty 1

## 2018-05-15 MED ORDER — HYDROMORPHONE HCL 1 MG/ML IJ SOLN
INTRAMUSCULAR | Status: AC
  Filled 2018-05-15: qty 1

## 2018-05-15 MED ORDER — LACTATED RINGERS IV SOLN
INTRAVENOUS | Status: DC | PRN
  Administered 2018-05-15: 11:00:00 via INTRAVENOUS

## 2018-05-15 MED ORDER — DEXAMETHASONE SODIUM PHOSPHATE 10 MG/ML IJ SOLN
INTRAMUSCULAR | Status: AC
  Filled 2018-05-15: qty 1

## 2018-05-15 MED ORDER — OXYBUTYNIN CHLORIDE 5 MG PO TABS: ORAL_TABLET | ORAL | 0 refills | Status: DC

## 2018-05-15 MED ORDER — LACTATED RINGERS IV SOLN
INTRAVENOUS | Status: DC
  Administered 2018-05-15: 09:00:00 via INTRAVENOUS

## 2018-05-15 MED ORDER — DEXAMETHASONE SODIUM PHOSPHATE 10 MG/ML IJ SOLN
INTRAMUSCULAR | Status: DC | PRN
  Administered 2018-05-15: 10 mg via INTRAVENOUS

## 2018-05-15 MED ORDER — LIDOCAINE HCL (PF) 2 % IJ SOLN
INTRAMUSCULAR | Status: AC
  Filled 2018-05-15: qty 10

## 2018-05-15 MED ORDER — MEPERIDINE HCL 50 MG/ML IJ SOLN: 6.2500 mg | INTRAMUSCULAR | Status: DC | PRN

## 2018-05-15 MED ORDER — SODIUM CHLORIDE 0.9 % IJ SOLN
INTRAMUSCULAR | Status: AC
  Filled 2018-05-15: qty 10

## 2018-05-15 MED ORDER — PROPOFOL 10 MG/ML IV BOLUS
INTRAVENOUS | Status: DC | PRN
  Administered 2018-05-15: 150 mg via INTRAVENOUS

## 2018-05-15 MED ORDER — IOTHALAMATE MEGLUMINE 43 % IV SOLN
INTRAVENOUS | Status: DC | PRN
  Administered 2018-05-15: 15 mL via SURGICAL_CAVITY

## 2018-05-15 MED ORDER — MIDAZOLAM HCL 2 MG/2ML IJ SOLN
INTRAMUSCULAR | Status: DC | PRN
  Administered 2018-05-15: 2 mg via INTRAVENOUS

## 2018-05-15 MED ORDER — CEFAZOLIN SODIUM-DEXTROSE 2-4 GM/100ML-% IV SOLN
INTRAVENOUS | Status: AC
  Filled 2018-05-15: qty 100

## 2018-05-15 MED ORDER — OXYBUTYNIN CHLORIDE 5 MG PO TABS
5.0000 mg | ORAL_TABLET | Freq: Three times a day (TID) | ORAL | Status: DC | PRN
  Administered 2018-05-15: 5 mg via ORAL
  Filled 2018-05-15 (×3): qty 1

## 2018-05-15 MED ORDER — LIDOCAINE HCL (CARDIAC) PF 100 MG/5ML IV SOSY
PREFILLED_SYRINGE | INTRAVENOUS | Status: DC | PRN
  Administered 2018-05-15: 80 mg via INTRAVENOUS

## 2018-05-15 MED ORDER — HYDROMORPHONE HCL 1 MG/ML IJ SOLN
INTRAMUSCULAR | Status: DC | PRN
  Administered 2018-05-15: .4 mg via INTRAVENOUS

## 2018-05-15 MED ORDER — ONDANSETRON HCL 4 MG/2ML IJ SOLN
INTRAMUSCULAR | Status: DC | PRN
  Administered 2018-05-15: 4 mg via INTRAVENOUS

## 2018-05-15 MED ORDER — FENTANYL CITRATE (PF) 100 MCG/2ML IJ SOLN: INTRAMUSCULAR | Status: DC | PRN

## 2018-05-15 MED ORDER — SUCCINYLCHOLINE CHLORIDE 20 MG/ML IJ SOLN
INTRAMUSCULAR | Status: AC
  Filled 2018-05-15: qty 1

## 2018-05-15 MED ORDER — PROMETHAZINE HCL 25 MG/ML IJ SOLN
INTRAMUSCULAR | Status: AC
  Filled 2018-05-15: qty 1

## 2018-05-15 MED ORDER — FENTANYL CITRATE (PF) 100 MCG/2ML IJ SOLN
INTRAMUSCULAR | Status: DC | PRN
  Administered 2018-05-15: 50 ug via INTRAVENOUS

## 2018-05-15 SURGICAL SUPPLY — 33 items
BAG DRAIN CYSTO-URO LG1000N (MISCELLANEOUS) ×2 IMPLANT
BASKET ZERO TIP 1.9FR (BASKET) ×1 IMPLANT
BRUSH SCRUB EZ 1% IODOPHOR (MISCELLANEOUS) ×2 IMPLANT
BSKT STON RTRVL ZERO TP 1.9FR (BASKET) ×1
CATH URETL 5X70 OPEN END (CATHETERS) IMPLANT
CNTNR SPEC 2.5X3XGRAD LEK (MISCELLANEOUS) ×1
CONRAY 43 FOR UROLOGY 50M (MISCELLANEOUS) ×2 IMPLANT
CONT SPEC 4OZ STER OR WHT (MISCELLANEOUS) ×1
CONT SPEC 4OZ STRL OR WHT (MISCELLANEOUS) ×1
CONTAINER SPEC 2.5X3XGRAD LEK (MISCELLANEOUS) IMPLANT
DRAPE UTILITY 15X26 TOWEL STRL (DRAPES) ×2 IMPLANT
FIBER LASER LITHO 273 (Laser) ×1 IMPLANT
GLOVE BIO SURGEON STRL SZ8 (GLOVE) ×2 IMPLANT
GOWN STRL REUS W/ TWL LRG LVL3 (GOWN DISPOSABLE) ×1 IMPLANT
GOWN STRL REUS W/ TWL XL LVL3 (GOWN DISPOSABLE) ×1 IMPLANT
GOWN STRL REUS W/TWL LRG LVL3 (GOWN DISPOSABLE) ×2
GOWN STRL REUS W/TWL XL LVL3 (GOWN DISPOSABLE) ×2
INFUSOR MANOMETER BAG 3000ML (MISCELLANEOUS) ×1 IMPLANT
INTRODUCER DILATOR DOUBLE (INTRODUCER) IMPLANT
KIT TURNOVER CYSTO (KITS) ×2 IMPLANT
PACK CYSTO AR (MISCELLANEOUS) ×2 IMPLANT
SENSORWIRE 0.038 NOT ANGLED (WIRE) ×4
SET CYSTO W/LG BORE CLAMP LF (SET/KITS/TRAYS/PACK) ×2 IMPLANT
SHEATH URETERAL 12FRX35CM (MISCELLANEOUS) IMPLANT
SOL .9 NS 3000ML IRR  AL (IV SOLUTION) ×1
SOL .9 NS 3000ML IRR AL (IV SOLUTION) ×1
SOL .9 NS 3000ML IRR UROMATIC (IV SOLUTION) ×1 IMPLANT
STENT URET 6FRX22 CONTOUR (STENTS) ×1 IMPLANT
STENT URET 6FRX24 CONTOUR (STENTS) IMPLANT
STENT URET 6FRX26 CONTOUR (STENTS) IMPLANT
SURGILUBE 2OZ TUBE FLIPTOP (MISCELLANEOUS) ×2 IMPLANT
WATER STERILE IRR 1000ML POUR (IV SOLUTION) ×2 IMPLANT
WIRE SENSOR 0.038 NOT ANGLED (WIRE) ×1 IMPLANT

## 2018-05-15 NOTE — Anesthesia Procedure Notes (Signed)
Procedure Name: LMA Insertion Date/Time: 05/15/2018 11:18 AM Performed by: Almeta Monas, CRNA Pre-anesthesia Checklist: Patient identified, Patient being monitored, Timeout performed, Emergency Drugs available and Suction available Patient Re-evaluated:Patient Re-evaluated prior to induction Oxygen Delivery Method: Circle system utilized Preoxygenation: Pre-oxygenation with 100% oxygen Induction Type: IV induction Ventilation: Mask ventilation without difficulty LMA: LMA inserted LMA Size: 3.5 Tube type: Oral Number of attempts: 1 Placement Confirmation: positive ETCO2 and breath sounds checked- equal and bilateral Tube secured with: Tape Dental Injury: Teeth and Oropharynx as per pre-operative assessment

## 2018-05-15 NOTE — Interval H&P Note (Signed)
History and Physical Interval Note: [name] seen last week for a 4 mm left mid ureteral calculus.  She has had intermittent, severe renal colic not well controlled with her oral analgesics.  The calculus was not visualized on KUB making shockwave lithotripsy less of an option.  She has elected to proceed with ureteroscopic removal.  05/15/2018 10:52 AM  Valerie Ford  has presented today for surgery, with the diagnosis of left ureteral calculus  The various methods of treatment have been discussed with the patient and family. After consideration of risks, benefits and other options for treatment, the patient has consented to  Procedure(s): CYSTOSCOPY/URETEROSCOPY/HOLMIUM LASER/STONE REMOVAL/STENT PLACEMENT (Left) as a surgical intervention .  The patient's history has been reviewed, patient examined, no change in status, stable for surgery.  I have reviewed the patient's chart and labs.  Questions were answered to the patient's satisfaction.     Scott C Stoioff

## 2018-05-15 NOTE — Discharge Instructions (Signed)

## 2018-05-15 NOTE — Transfer of Care (Addendum)
Immediate Anesthesia Transfer of Care Note  Patient: Valerie Ford  Procedure(s) Performed: CYSTOSCOPY/URETEROSCOPY/HOLMIUM LASER/STONE REMOVAL/STENT PLACEMENT (Left )  Patient Location: PACU  Anesthesia Type:General  Level of Consciousness: sedated  Airway & Oxygen Therapy: Patient Spontanous Breathing and Patient connected to face mask oxygen  Post-op Assessment: Report given to RN and Post -op Vital signs reviewed and stable  Post vital signs: Reviewed and stable  Last Vitals:  Vitals Value Taken Time  BP 144/80 05/15/2018 12:07 PM  Temp 36.6 C 05/15/2018 12:05 PM  Pulse 59 05/15/2018 12:10 PM  Resp 10 05/15/2018 12:10 PM  SpO2 100 % 05/15/2018 12:10 PM  Vitals shown include unvalidated device data.  Last Pain:  Vitals:   05/15/18 0814  TempSrc: Temporal  PainSc: 0-No pain         Complications: No apparent anesthesia complications

## 2018-05-15 NOTE — Anesthesia Preprocedure Evaluation (Addendum)
Anesthesia Evaluation  Patient identified by MRN, date of birth, ID band Patient awake    Reviewed: Allergy & Precautions, NPO status , Patient's Chart, lab work & pertinent test results  History of Anesthesia Complications Negative for: history of anesthetic complications  Airway Mallampati: III  TM Distance: >3 FB Neck ROM: Full    Dental no notable dental hx.    Pulmonary sleep apnea (diagnosed prior to gastric bypass surgery, hasn't ever had CPAP) , neg COPD, former smoker,    breath sounds clear to auscultation- rhonchi (-) wheezing      Cardiovascular Exercise Tolerance: Good (-) hypertension(-) CAD, (-) Past MI, (-) Cardiac Stents and (-) CABG  Rhythm:Regular Rate:Normal - Systolic murmurs and - Diastolic murmurs    Neuro/Psych  Headaches, PSYCHIATRIC DISORDERS (ADHD)    GI/Hepatic Neg liver ROS, GERD  ,  Endo/Other  negative endocrine ROSneg diabetes  Renal/GU negative Renal ROS     Musculoskeletal  (+) Arthritis ,   Abdominal (+) - obese,   Peds  Hematology negative hematology ROS (+)   Anesthesia Other Findings Past Medical History: 01/08/2018: ADHD 01/07/2018: Fatty liver 01/07/2018: GERD (gastroesophageal reflux disease) 01/07/2018: History of bariatric surgery 01/11/2018: History of cocaine use, remote, > 20 years 01/11/2018: Migraine with aura and without status migrainosus, not  intractable 10/20/2016: Obstructive sleep apnea syndrome 01/07/2018: Osteoarthritis of right knee 01/07/2018: Sacroiliitis (HCC) 01/11/2018: Seasonal allergic rhinitis due to pollen 01/11/2018: Vitamin D deficiency   Reproductive/Obstetrics                             Anesthesia Physical Anesthesia Plan  ASA: II  Anesthesia Plan: General   Post-op Pain Management:    Induction: Intravenous  PONV Risk Score and Plan: 2 and Ondansetron, Dexamethasone and Midazolam  Airway Management Planned:  LMA  Additional Equipment:   Intra-op Plan:   Post-operative Plan:   Informed Consent: I have reviewed the patients History and Physical, chart, labs and discussed the procedure including the risks, benefits and alternatives for the proposed anesthesia with the patient or authorized representative who has indicated his/her understanding and acceptance.   Dental advisory given  Plan Discussed with: CRNA and Anesthesiologist  Anesthesia Plan Comments:        Anesthesia Quick Evaluation

## 2018-05-15 NOTE — Anesthesia Post-op Follow-up Note (Signed)
Anesthesia QCDR form completed.        

## 2018-05-15 NOTE — Anesthesia Postprocedure Evaluation (Signed)
Anesthesia Post Note  Patient: Valerie Ford  Procedure(s) Performed: CYSTOSCOPY/URETEROSCOPY/HOLMIUM LASER/STONE REMOVAL/STENT PLACEMENT (Left )  Patient location during evaluation: PACU Anesthesia Type: General Level of consciousness: awake and alert and oriented Pain management: pain level controlled Vital Signs Assessment: post-procedure vital signs reviewed and stable Respiratory status: spontaneous breathing, nonlabored ventilation and respiratory function stable Cardiovascular status: blood pressure returned to baseline and stable Postop Assessment: no signs of nausea or vomiting Anesthetic complications: no     Last Vitals:  Vitals:   05/15/18 1235 05/15/18 1246  BP: (!) 142/72   Pulse: 60 77  Resp: 16 18  Temp: 36.8 C   SpO2: 96% 95%    Last Pain:  Vitals:   05/15/18 1246  TempSrc:   PainSc: 7                  Shady Padron

## 2018-05-16 NOTE — Telephone Encounter (Signed)
Called pt informed her of rx at the pharmacy. Pt gave verbal understanding

## 2018-05-16 NOTE — Op Note (Signed)
Preoperative diagnosis: Left ureteral calculus  Postoperative diagnosis: Left ureteral calculus  Procedure:  1. Cystoscopy 2. Left ureteroscopy and stone removal 3. Ureteroscopic laser lithotripsy 4. Left ureteral stent placement (6 FR) 22 cm 5. Left retrograde pyelography with interpretation  Surgeon: Lorin Picket C. Kari Montero, M.D.  Anesthesia: General  Complications: None  Intraoperative findings:  1.  Left retrograde pyelography post procedure showed no filling defects, stone fragments or contrast extravasation  EBL: Minimal  Specimens: 1. Calculus fragments for analysis   Indication: Valerie Ford is a 62 y.o. year old patient seen in the ED on 05/03/2018 with left renal colic secondary to a 4 mm mid ureteral calculus.  She has had continued pain.  The calculus was not visualized on KUB. After reviewing the management options for treatment, the patient elected to proceed with the above surgical procedure(s). We have discussed the potential benefits and risks of the procedure, side effects of the proposed treatment, the likelihood of the patient achieving the goals of the procedure, and any potential problems that might occur during the procedure or recuperation. Informed consent has been obtained.  Description of procedure:  The patient was taken to the operating room and general anesthesia was induced.  The patient was placed in the dorsal lithotomy position, prepped and draped in the usual sterile fashion, and preoperative antibiotics were administered. A preoperative time-out was performed.   A 21 French cystoscope was lubricated and passed under direct vision.  The urethra was normal in appearance.  Panendoscopy was performed and the bladder mucosa showed no erythema, solid or papillary lesions.  Attention was directed to the left ureteral orifice and a 0.038 Sensor wire was then advanced up the ureter into the renal pelvis under fluoroscopic guidance.  A 4.5 Fr semirigid  ureteroscope was then advanced into the ureter next to the guidewire and the calculus was identified in the distal ureter.   The stone was then fragmented with a 273 micron holmium laser fiber on a setting of 5 J and frequency of 0.5 hz.   The calculus was fragmented and half and then removed from the ureter with a zero tip nitinol basket.  Reinspection of the ureter revealed no remaining visible stones or fragments.   Retrograde pyelogram was performed with findings as described above.  A 6 FR/22 CM stent was was placed under fluoroscopic guidance.  The wire was then removed with an adequate stent curl noted in the renal pelvis as well as in the bladder.  The bladder was then emptied and the procedure ended.  The patient appeared to tolerate the procedure well and without complications.  After anesthetic reversal the patient was transported to the PACU in stable condition.   Plan: The stent was left attached to a tether and the patient was instructed to remove on 05/20/2018  Irineo Axon, MD

## 2018-05-17 ENCOUNTER — Telehealth: Payer: Self-pay | Admitting: Urology

## 2018-05-17 MED ORDER — KETOROLAC TROMETHAMINE 10 MG PO TABS: 10.0000 mg | ORAL_TABLET | Freq: Four times a day (QID) | ORAL | 0 refills | Status: DC | PRN

## 2018-05-17 NOTE — Telephone Encounter (Signed)
Patient had a stent placed on 05/15/2018.  She is an Therapist, sports, full time student and states that the [facility] makes her sleepy and not be able to complete her school work.  She states that TORODAL is the only thing that works for pain and still be able to function.  She is requesting a Rx to be sent to her pharmacy.  Please advise.

## 2018-05-17 NOTE — Telephone Encounter (Signed)
Rx was sent  

## 2018-05-18 NOTE — Telephone Encounter (Signed)
Called pt informed her of the new rx at pharmacy. Pt states that she picked up rx last night.

## 2018-05-19 LAB — STONE ANALYSIS
Ca Oxalate,Dihydrate: 70 %
Ca Oxalate,Monohydr.: 20 %
Ca phos cry stone ql IR: 10 %
STONE WEIGHT KSTONE: 2.6 mg

## 2018-05-21 ENCOUNTER — Ambulatory Visit: Payer: BLUE CROSS/BLUE SHIELD

## 2018-05-24 ENCOUNTER — Telehealth: Payer: Self-pay | Admitting: Family Medicine

## 2018-05-24 NOTE — Telephone Encounter (Signed)
Patient called again with complaints of fatigue. She questioned if fatigue is common post surgery. I informed her it is uncommon to be super fatigued, I asked her if she has been drinking fluids and eating, she states since her Bariatric surgery she has been unable to eat and drink a lot. I informed her to try to drink Pediasure and eat a small healthy meal, if she is not feeling any better she may need to contact her PCP to be evaluated for fatigue.

## 2018-05-26 ENCOUNTER — Encounter: Payer: Self-pay | Admitting: Family Medicine

## 2018-05-26 ENCOUNTER — Ambulatory Visit: Payer: BLUE CROSS/BLUE SHIELD | Admitting: Family Medicine

## 2018-05-26 VITALS — BP 134/78 | HR 70 | Temp 98.1°F | Wt 165.0 lb

## 2018-05-26 DIAGNOSIS — R110 Nausea: Secondary | ICD-10-CM

## 2018-05-26 DIAGNOSIS — J111 Influenza due to unidentified influenza virus with other respiratory manifestations: Secondary | ICD-10-CM

## 2018-05-26 DIAGNOSIS — R69 Illness, unspecified: Secondary | ICD-10-CM

## 2018-05-26 DIAGNOSIS — R52 Pain, unspecified: Secondary | ICD-10-CM

## 2018-05-26 LAB — POC URINALSYSI DIPSTICK (AUTOMATED)
GLUCOSE UA: NEGATIVE
Leukocytes, UA: NEGATIVE
Nitrite, UA: NEGATIVE
Protein, UA: POSITIVE — AB
SPEC GRAV UA: 1.02 (ref 1.010–1.025)
Urobilinogen, UA: 1 E.U./dL
pH, UA: 6 (ref 5.0–8.0)

## 2018-05-26 LAB — POC INFLUENZA A&B (BINAX/QUICKVUE)
Influenza A, POC: NEGATIVE
Influenza B, POC: NEGATIVE

## 2018-05-26 MED ORDER — OSELTAMIVIR PHOSPHATE 75 MG PO CAPS: 75.0000 mg | ORAL_CAPSULE | Freq: Two times a day (BID) | ORAL | 0 refills | Status: DC

## 2018-05-26 NOTE — Progress Notes (Signed)
BP 134/78   Pulse 70   Temp 98.1 F (36.7 C) (Oral)   Wt 165 lb (74.8 kg)   LMP 03/17/2005 (Approximate)   SpO2 97%   BMI 28.77 kg/m    CC: fever, body aches Subjective:    Patient ID: Valerie Ford, female    DOB: January 17, 1956, 62 y.o.   MRN: [id number]  HPI: Valerie Ford is a 62 y.o. female presenting on 05/26/2018 for Generalized Body Aches (x 3-4 days... pt has been taking OTC Tylenol ands Zofran w/ some relief....); Nausea; and Fever (101-102)   3-4d ho fever to 101-102 at home, chills, body aches, "I feel run over by [facility] truck", dry cough, nausea (treating with zofran).   Treating with tylenol at home.  No sick contacts at home. No h/o asthma.  No smokers at home. Didn't get flu shot this year.   Recently in the hospital with kidney stones s/p stent earlier this month [facility]). She denies dysuria, urgency, frequency, hematuria, or other UTI symptoms.  Relevant past medical, surgical, family and social history reviewed and updated as indicated. Interim medical history since our last visit reviewed. Allergies and medications reviewed and updated. Outpatient Medications Prior to Visit  Medication Sig Dispense Refill  . amphetamine-dextroamphetamine (ADDERALL) 10 MG tablet Take 5-10 mg by mouth See admin instructions. Take 1 tablet in the AM, 1/2 tablet mid-morning, and 1 tablet in the afternoon.  0  . celecoxib (CELEBREX) 200 MG capsule Take 200 mg by mouth daily.     . Cyanocobalamin (VITAMIN B-12) 5000 MCG TBDP Take 5,000 mcg by mouth daily.    . fexofenadine (ALLEGRA) 180 MG tablet Take 180 mg by mouth daily as needed for allergies.   2  . fluticasone (FLONASE) 50 MCG/ACT nasal spray Place 1 spray into both nostrils daily as needed for allergies or rhinitis.    . furosemide (LASIX) 20 MG tablet Take 20 mg by mouth daily as needed (swelling).     Marland Kitchen HYDROcodone-acetaminophen (NORCO) 5-325 MG tablet Take 1-2 tablets by mouth every 4 (four) hours as needed for moderate  pain. 20 tablet 0  . HYDROcodone-homatropine (HYCODAN) 5-1.5 MG/5ML syrup Take 5 mLs by mouth at bedtime as needed for cough. 120 mL 0  . hydrOXYzine (ATARAX/VISTARIL) 25 MG tablet Take 25-50 mg by mouth every 6 (six) hours as needed (severe headache).    Marland Kitchen ketorolac (TORADOL) 10 MG tablet Take 1 tablet (10 mg total) by mouth every 6 (six) hours as needed. 20 tablet 0  . Multiple Vitamin (MULTIVITAMIN WITH MINERALS) TABS tablet Take 1 tablet by mouth daily.    Marland Kitchen omeprazole (PRILOSEC) 20 MG capsule Take 20 mg by mouth daily before breakfast.     . ondansetron (ZOFRAN) 4 MG tablet Take 1 tablet (4 mg total) by mouth daily as needed for nausea or vomiting. 14 tablet 0  . OVER THE COUNTER MEDICATION Take 3 drops by mouth 2 (two) times daily. CBD oil    . oxybutynin (DITROPAN) 5 MG tablet 1 tab tid prn frequency,urgency, bladder spasm 30 tablet 0  . potassium chloride (K-DUR) 10 MEQ tablet Take 10 mEq by mouth daily as needed (Takes with furosemide.).     Marland Kitchen tamsulosin (FLOMAX) 0.4 MG CAPS capsule Take 1 capsule (0.4 mg total) by mouth daily after supper. 10 capsule 0  . tiZANidine (ZANAFLEX) 4 MG tablet Take 12 mg by mouth at bedtime.     . verapamil (CALAN-SR) 120 MG CR tablet Take 120 mg  by mouth 2 (two) times daily.      No facility-administered medications prior to visit.      Per HPI unless specifically indicated in ROS section below Review of Systems     Objective:    BP 134/78   Pulse 70   Temp 98.1 F (36.7 C) (Oral)   Wt 165 lb (74.8 kg)   LMP 03/17/2005 (Approximate)   SpO2 97%   BMI 28.77 kg/m   Wt Readings from Last 3 Encounters:  05/26/18 165 lb (74.8 kg)  05/15/18 167 lb (75.8 kg)  05/10/18 171 lb (77.6 kg)    Physical Exam  Constitutional: She appears well-developed and well-nourished. No distress.  Tired appearing, nontoxic  HENT:  Head: Normocephalic and atraumatic.  Right Ear: Hearing, tympanic membrane, external ear and ear canal normal.  Left Ear: Hearing,  tympanic membrane, external ear and ear canal normal.  Nose: No mucosal edema or rhinorrhea. Right sinus exhibits no maxillary sinus tenderness and no frontal sinus tenderness. Left sinus exhibits no maxillary sinus tenderness and no frontal sinus tenderness.  Mouth/Throat: Uvula is midline and mucous membranes are normal. Posterior oropharyngeal erythema present. No oropharyngeal exudate, posterior oropharyngeal edema or tonsillar abscesses.  MMM Dry nasal mucosa  Eyes: Pupils are equal, round, and reactive to light. Conjunctivae and EOM are normal. No scleral icterus.  Neck: Normal range of motion. Neck supple.  Cardiovascular: Normal rate, regular rhythm, normal heart sounds and intact distal pulses.  No murmur heard. Pulmonary/Chest: Effort normal and breath sounds normal. No respiratory distress. She has no wheezes. She has no rales.  Lungs clear  Lymphadenopathy:    She has no cervical adenopathy.  Skin: Skin is warm and dry. No rash noted.  Nursing note and vitals reviewed.  Results for orders placed or performed in visit on 05/26/18  POC Influenza A&B(BINAX/QUICKVUE)  Result Value Ref Range   Influenza A, POC Negative Negative   Influenza B, POC Negative Negative  POCT Urinalysis Dipstick (Automated)  Result Value Ref Range   Color, UA auburn    Clarity, UA hazy    Glucose, UA Negative Negative   Bilirubin, UA +    Ketones, UA +    Spec Grav, UA 1.020 1.010 - 1.025   Blood, UA small    pH, UA 6.0 5.0 - 8.0   Protein, UA Positive (A) Negative   Urobilinogen, UA 1.0 0.2 or 1.0 E.U./dL   Nitrite, UA neg    Leukocytes, UA Negative Negative       Assessment & Plan:  Given recent urological procedure, back pain, fever, check UA r/o UTI related symptoms.  Problem List Items Addressed This Visit    Influenza-like illness - Primary    Flu swab - negative - may be falsely negative given marked dryness of nasal mucosa leading to poor sample.  Given story/exam very consistent  with flu will treat presumptively with tamiflu. UA today not consistent with infection.  Supportive care reviewed.  Update if not improving with treatment. ER precautions reviewed (worsening symptoms, signs of dehydration, etc)       Other Visit Diagnoses    Generalized body aches       Relevant Orders   POC Influenza A&B(BINAX/QUICKVUE) (Completed)   POCT Urinalysis Dipstick (Automated) (Completed)   Nausea       Relevant Orders   POCT Urinalysis Dipstick (Automated) (Completed)       Meds ordered this encounter  Medications  . oseltamivir (TAMIFLU) 75 MG capsule  Sig: Take 1 capsule (75 mg total) by mouth 2 (two) times daily.    Dispense:  10 capsule    Refill:  0   Orders Placed This Encounter  Procedures  . POC Influenza A&B(BINAX/QUICKVUE)  . POCT Urinalysis Dipstick (Automated)    Follow up plan: Return in about 1 year (around 05/27/2019), or if symptoms worsen or fail to improve.  Eustaquio BoydenJavier Delois Silvester, MD

## 2018-05-26 NOTE — Patient Instructions (Addendum)
You likely have influenza even though test was negative - take tamiflu sent to pharmacy.  Continue small sips throughout the day.   Influenza, Adult Influenza, more commonly known as "the flu," is a viral infection that primarily affects the respiratory tract. The respiratory tract includes organs that help you breathe, such as the lungs, nose, and throat. The flu causes many common cold symptoms, as well as a high fever and body aches. The flu spreads easily from person to person (is contagious). Getting a flu shot (influenza vaccination) every year is the best way to prevent influenza. What are the causes? Influenza is caused by a virus. You can catch the virus by:  Breathing in droplets from an infected person's cough or sneeze.  Touching something that was recently contaminated with the virus and then touching your mouth, nose, or eyes.  What increases the risk? The following factors may make you more likely to get the flu:  Not cleaning your hands frequently with soap and water or alcohol-based hand sanitizer.  Having close contact with many people during cold and flu season.  Touching your mouth, eyes, or nose without washing or sanitizing your hands first.  Not drinking enough fluids or not eating a healthy diet.  Not getting enough sleep or exercise.  Being under a high amount of stress.  Not getting a yearly (annual) flu shot.  You may be at a higher risk of complications from the flu, such as a severe lung infection (pneumonia), if you:  Are over the age of 63.  Are pregnant.  Have a weakened disease-fighting system (immune system). You may have a weakened immune system if you: ? Have HIV or AIDS. ? Are undergoing chemotherapy. ? Aretaking medicines that reduce the activity of (suppress) the immune system.  Have a long-term (chronic) illness, such as heart disease, kidney disease, diabetes, or lung disease.  Have a liver disorder.  Are obese.  Have  anemia.  What are the signs or symptoms? Symptoms of this condition typically last 4-10 days and may include:  Fever.  Chills.  Headache, body aches, or muscle aches.  Sore throat.  Cough.  Runny or congested nose.  Chest discomfort and cough.  Poor appetite.  Weakness or tiredness (fatigue).  Dizziness.  Nausea or vomiting.  How is this diagnosed? This condition may be diagnosed based on your medical history and a physical exam. Your health care provider may do a nose or throat swab test to confirm the diagnosis. How is this treated? If influenza is detected early, you can be treated with antiviral medicine that can reduce the length of your illness and the severity of your symptoms. This medicine may be given by mouth (orally) or through an IV tube that is inserted in one of your veins. The goal of treatment is to relieve symptoms by taking care of yourself at home. This may include taking over-the-counter medicines, drinking plenty of fluids, and adding humidity to the air in your home. In some cases, influenza goes away on its own. Severe influenza or complications from influenza may be treated in a hospital. Follow these instructions at home:  Take over-the-counter and prescription medicines only as told by your health care provider.  Use a cool mist humidifier to add humidity to the air in your home. This can make breathing easier.  Rest as needed.  Drink enough fluid to keep your urine clear or pale yellow.  Cover your mouth and nose when you cough or sneeze.  Wash your hands with soap and water often, especially after you cough or sneeze. If soap and water are not available, use hand sanitizer.  Stay home from work or school as told by your health care provider. Unless you are visiting your health care provider, try to avoid leaving home until your fever has been gone for 24 hours without the use of medicine.  Keep all follow-up visits as told by your health  care provider. This is important. How is this prevented?  Getting an annual flu shot is the best way to avoid getting the flu. You may get the flu shot in late summer, fall, or winter. Ask your health care provider when you should get your flu shot.  Wash your hands often or use hand sanitizer often.  Avoid contact with people who are sick during cold and flu season.  Eat a healthy diet, drink plenty of fluids, get enough sleep, and exercise regularly. Contact a health care provider if:  You develop new symptoms.  You have: ? Chest pain. ? Diarrhea. ? A fever.  Your cough gets worse.  You produce more mucus.  You feel nauseous or you vomit. Get help right away if:  You develop shortness of breath or difficulty breathing.  Your skin or nails turn a bluish color.  You have severe pain or stiffness in your neck.  You develop a sudden headache or sudden pain in your face or ear.  You cannot stop vomiting. This information is not intended to replace advice given to you by your health care provider. Make sure you discuss any questions you have with your health care provider. Document Released: 06/24/2000 Document Revised: 12/03/2015 Document Reviewed: 04/21/2015 Elsevier Interactive Patient Education  2017 [employer].

## 2018-05-26 NOTE — Assessment & Plan Note (Addendum)
Flu swab - negative - may be falsely negative given marked dryness of nasal mucosa leading to poor sample.  Given story/exam very consistent with flu will treat presumptively with tamiflu. UA today not consistent with infection.  Supportive care reviewed.  Update if not improving with treatment. ER precautions reviewed (worsening symptoms, signs of dehydration, etc)

## 2018-06-06 NOTE — Progress Notes (Signed)
06/11/2018 12:48 PM  Valerie Ford Nov 06, 1955 [id number]  Referring provider: Helane RimaWallace, Erica, DO [address] [place], [place] [id number]  Chief Complaint  Patient presents with  . Routine Post Op    HPI: Valerie LoopSusan D Ford is a 62 y.o. female with a history of left renal colic secondary to 4mm left mid ureteral calculus with mild hydronephrosis/hydroureter. On 05/16/2015 she had a left cystoscopy/ureteroscopy/holmium laser/stone removal/stent placement. She presents today for her 4 week post op.   Prior to this stone removal, she has had a stone endoscopically removed ~20 years ago. She reports that her stent was removed and denies dysuria, gross hematuria or flank/abdominal/pelvic/groin pain. Recent CT did not reveal further stones.   She recently increased her tea drinking following a bariatric surgery. Her PVR was 24 mL as of 05/10/18.  PMH: Past Medical History:  Diagnosis Date  . ADHD 01/08/2018  . Fatty liver 01/07/2018  . GERD (gastroesophageal reflux disease) 01/07/2018  . History of bariatric surgery 01/07/2018  . History of cocaine use, remote, > 20 years 01/11/2018  . Migraine with aura and without status migrainosus, not intractable 01/11/2018  . Obstructive sleep apnea syndrome 10/20/2016  . Osteoarthritis of right knee 01/07/2018  . Sacroiliitis (HCC) 01/07/2018  . Seasonal allergic rhinitis due to pollen 01/11/2018  . Vitamin D deficiency 01/11/2018   Surgical History: Past Surgical History:  Procedure Laterality Date  . CESAREAN SECTION    . CYSTOSCOPY/URETEROSCOPY/HOLMIUM LASER/STENT PLACEMENT Left 05/15/2018   Procedure: CYSTOSCOPY/URETEROSCOPY/HOLMIUM LASER/STONE REMOVAL/STENT PLACEMENT;  Surgeon: Riki AltesStoioff, Andy Allende C, MD;  Location: [facility] ORS;  Service: Urology;  Laterality: Left;  Marland Kitchen. GASTRIC ROUX-EN-Y     Home Medications:  Allergies as of 06/11/2018      Reactions   Sulfa Antibiotics Hives, Rash   Red and swollen ears   Codeine Nausea And Vomiting   Iodinated  Diagnostic Agents Nausea And Vomiting   Nausea and vomiting after IV contast dye approximately 35 years ago.   Poison Ivy Extract Rash   Severe rash   Tramadol Nausea And Vomiting   Migraine      Medication List        Accurate as of 06/11/18 12:48 PM. Always use your most recent med list.          amphetamine-dextroamphetamine 10 MG tablet Commonly known as:  ADDERALL Take 5-10 mg by mouth See admin instructions. Take 1 tablet in the AM, 1/2 tablet mid-morning, and 1 tablet in the afternoon.   celecoxib 200 MG capsule Commonly known as:  CELEBREX Take 200 mg by mouth daily.   fexofenadine 180 MG tablet Commonly known as:  ALLEGRA Take 180 mg by mouth daily as needed for allergies.   fluticasone 50 MCG/ACT nasal spray Commonly known as:  FLONASE Place 1 spray into both nostrils daily as needed for allergies or rhinitis.   furosemide 20 MG tablet Commonly known as:  LASIX Take 20 mg by mouth daily as needed (swelling).   multivitamin with minerals Tabs tablet Take 1 tablet by mouth daily.   omeprazole 20 MG capsule Commonly known as:  PRILOSEC Take 20 mg by mouth daily before breakfast.   OVER THE COUNTER MEDICATION Take 3 drops by mouth 2 (two) times daily. CBD oil   potassium chloride 10 MEQ tablet Commonly known as:  K-DUR Take 10 mEq by mouth daily as needed (Takes with furosemide.).   verapamil 120 MG CR tablet Commonly known as:  CALAN-SR Take 120 mg by mouth 2 (two) times  daily.   Vitamin B-12 5000 MCG Tbdp Take 5,000 mcg by mouth daily.      Allergies:  Allergies  Allergen Reactions  . Sulfa Antibiotics Hives and Rash    Red and swollen ears  . Codeine Nausea And Vomiting  . Iodinated Diagnostic Agents Nausea And Vomiting    Nausea and vomiting after IV contast dye approximately 35 years ago.  . Poison Ivy Extract Rash    Severe rash  . Tramadol Nausea And Vomiting    Migraine    Family History: Family History  Problem Relation Age of  Onset  . Breast cancer Maternal Grandmother 74  . Arthritis Maternal Grandmother   . Asthma Maternal Grandmother   . Depression Maternal Grandmother   . Heart attack Maternal Grandmother   . Arthritis Mother   . Asthma Mother   . COPD Mother   . High Cholesterol Mother   . Hypertension Mother   . Stroke Mother   . Heart disease Father   . Hypertension Father   . Alcohol abuse Sister   . COPD Sister   . Alcohol abuse Brother   . Depression Brother   . Drug abuse Brother    Social History:  reports that she has quit smoking. She has never used smokeless tobacco. She reports that she has current or past drug history. She reports that she does not drink alcohol.  ROS: UROLOGY Frequent Urination?: No Hard to postpone urination?: No Burning/pain with urination?: No Get up at night to urinate?: No Leakage of urine?: No Urine stream starts and stops?: No Trouble starting stream?: No Do you have to strain to urinate?: No Blood in urine?: No Urinary tract infection?: No Sexually transmitted disease?: No Injury to kidneys or bladder?: No Painful intercourse?: No Weak stream?: No Currently pregnant?: No Vaginal bleeding?: No Last menstrual period?: n  Gastrointestinal Nausea?: Yes Vomiting?: No Indigestion/heartburn?: No Diarrhea?: No Constipation?: No  Constitutional Fever: Yes Night sweats?: No Weight loss?: No Fatigue?: No  Skin Skin rash/lesions?: No Itching?: No  Eyes Blurred vision?: No Double vision?: No  Ears/Nose/Throat Sore throat?: Yes Sinus problems?: No  Hematologic/Lymphatic Swollen glands?: No Easy bruising?: No  Cardiovascular Leg swelling?: No Chest pain?: No  Respiratory Cough?: Yes Shortness of breath?: Yes  Endocrine Excessive thirst?: No  Musculoskeletal Back pain?: No Joint pain?: No  Neurological Headaches?: No Dizziness?: No  Psychologic Depression?: No Anxiety?: No  Physical Exam: BP 125/83 (BP Location: Left  Arm, Patient Position: Sitting, Cuff Size: Normal)   Pulse 77   Ht 5' 3.5" (1.613 m)   Wt 165 lb 12.8 oz (75.2 kg)   LMP 03/17/2005 (Approximate)   BMI 28.91 kg/m   Constitutional:  Alert and oriented, No acute distress. Cardiovascular: No clubbing, cyanosis, or edema. Respiratory: Normal respiratory effort, no increased work of breathing. Skin: No rashes, bruises or suspicious lesions. Neurologic: Grossly intact, no focal deficits, moving all 4 extremities. Psychiatric: Normal mood and affect.  Laboratory Data: Lab Results  Component Value Date   WBC 7.3 05/03/2018   HGB 13.6 05/03/2018   HCT 41.2 05/03/2018   MCV 87.7 05/03/2018   PLT 330 05/03/2018   Lab Results  Component Value Date   CREATININE 0.88 05/03/2018    Assessment & Plan:  1. Ureterolithiasis - Status post Ureteroscopy. History of stones.  - We discussed metabolic evaluation versus general dietary guidelines. She has elected the latter. Presented patient with literature.  - It was recommended that she increase her water intake to  keep urine output greater than 2 L per day.  Ten 10 ounce glasses of water per day is generally enough to produce this output.  Oxalate moderation was discussed and she was provided literature on high oxalate foods and beverages.  Avoidance of salty foods and added salt was discussed as well as avoidance of excessive intake of animal protein.  Increased intake of potassium rich citrus products was recommended. - RTC in 6 months for KUB  Return in about 6 months (around 12/11/2018) for KUB.   Riki Altes, MD  [facility] [address], [address] [place], [place] [address] [phone]  I, Robbi Garter , am acting as a Neurosurgeon for [employer], MD  I have reviewed the above information for completeness and accuracy and agree. Irineo Axon, MD

## 2018-06-08 ENCOUNTER — Ambulatory Visit: Payer: Self-pay | Admitting: *Deleted

## 2018-06-08 NOTE — Telephone Encounter (Signed)
Pt called with complaints of dizziness and chest heaviness for 2 days; she also has had tamiflu 05/26/18; she also complains of not having energy; she is having more weakness but the dizziness occurs when she bends over; she is not sure how much she is drinking 2 -16 ounce bottles of water/per day plus coffee; she says that her chest pain feels like congestion and an occassional non-productive cough; she says that she felt better for a few days but her energy level feels horrible; she also says that 06/07/18 she had burning with urination and her urine looked concentrated;also reviewed after visit note by DR Sharen HonesGutierrez dated 05/26/18; nurse triage initiated and recommendations made per protocol; the pt verbalizes understanding and will proceed to the ED; the pt normally sees Dr Helane RimaErica Wallace, [facility]; will route to office for notification of this encounter.       Reason for Disposition . Patient sounds very sick or weak to the triager  Answer Assessment - Initial Assessment Questions 1. DESCRIPTION: "Describe your dizziness."     Light headed 2. LIGHTHEADED: "Do you feel lightheaded?" (e.g., somewhat faint, woozy, weak upon standing)     Dizzy when she bends over 3. VERTIGO: "Do you feel like either you or the room is spinning or tilting?" (i.e. vertigo)     no 4. SEVERITY: "How bad is it?"  "Do you feel like you are going to faint?" "Can you stand and walk?"   - MILD - walking normally   - MODERATE - interferes with normal activities (e.g., work, school)    - SEVERE - unable to stand, requires support to walk, feels like passing out now.      moderate 5. ONSET:  "When did the dizziness begin?"     11/17/`9 6. AGGRAVATING FACTORS: "Does anything make it worse?" (e.g., standing, change in head position)     Changing the position of her head 7. HEART RATE: "Can you tell me your heart rate?" "How many beats in 15 seconds?"  (Note: not all patients can do this)       80 beats in 1 minute  per pt 8. CAUSE: "What do you think is causing the dizziness?"     Not sure 9. RECURRENT SYMPTOM: "Have you had dizziness before?" If so, ask: "When was the last time?" "What happened that time?"     Yes; usually right before she has a migraine headache 10. OTHER SYMPTOMS: "Do you have any other symptoms?" (e.g., fever, chest pain, vomiting, diarrhea, bleeding)       Weakness, chest heaviness, ? Chest congestion 11. PREGNANCY: "Is there any chance you are pregnant?" "When was your last menstrual period?"       no  Protocols used: DIZZINESS [facility]- LIGHTHEADEDNESS-A-AH

## 2018-06-11 ENCOUNTER — Encounter: Payer: Self-pay | Admitting: Urology

## 2018-06-11 ENCOUNTER — Ambulatory Visit (INDEPENDENT_AMBULATORY_CARE_PROVIDER_SITE_OTHER): Payer: BLUE CROSS/BLUE SHIELD | Admitting: Urology

## 2018-06-11 VITALS — BP 125/83 | HR 77 | Ht 63.5 in | Wt 165.8 lb

## 2018-06-11 DIAGNOSIS — Z9889 Other specified postprocedural states: Secondary | ICD-10-CM | POA: Diagnosis not present

## 2018-06-11 DIAGNOSIS — N201 Calculus of ureter: Secondary | ICD-10-CM

## 2018-06-11 LAB — MICROSCOPIC EXAMINATION
Epithelial Cells (non renal): NONE SEEN /hpf (ref 0–10)
RBC, UA: NONE SEEN /hpf (ref 0–2)
WBC, UA: NONE SEEN /hpf (ref 0–5)

## 2018-06-11 LAB — URINALYSIS, COMPLETE
BILIRUBIN UA: NEGATIVE
Glucose, UA: NEGATIVE
LEUKOCYTES UA: NEGATIVE
NITRITE UA: NEGATIVE
PH UA: 5.5 (ref 5.0–7.5)
RBC UA: NEGATIVE
Urobilinogen, Ur: 0.2 mg/dL (ref 0.2–1.0)

## 2018-06-11 NOTE — Telephone Encounter (Signed)
Called patient she still feels bad she did not go to ED. She only took 3 of Tamiflu that she was started on 11/16 and started feeling better so she stopped. She has app with Urologist this am. She has been having sore throat and fever. She did start back on Tamiflu on Friday. She took Two daily of those. I have made app with or office for eval for strep.
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
## 2018-06-13 ENCOUNTER — Ambulatory Visit: Payer: BLUE CROSS/BLUE SHIELD | Admitting: Family Medicine

## 2018-06-13 VITALS — BP 125/88 | HR 70 | Temp 98.6°F | Ht 63.5 in | Wt 166.4 lb

## 2018-06-13 DIAGNOSIS — R5381 Other malaise: Secondary | ICD-10-CM | POA: Diagnosis not present

## 2018-06-13 DIAGNOSIS — K760 Fatty (change of) liver, not elsewhere classified: Secondary | ICD-10-CM | POA: Diagnosis not present

## 2018-06-13 DIAGNOSIS — E559 Vitamin D deficiency, unspecified: Secondary | ICD-10-CM | POA: Diagnosis not present

## 2018-06-13 DIAGNOSIS — R69 Illness, unspecified: Secondary | ICD-10-CM

## 2018-06-13 DIAGNOSIS — Z9884 Bariatric surgery status: Secondary | ICD-10-CM | POA: Diagnosis not present

## 2018-06-13 DIAGNOSIS — K219 Gastro-esophageal reflux disease without esophagitis: Secondary | ICD-10-CM | POA: Diagnosis not present

## 2018-06-13 DIAGNOSIS — R5383 Other fatigue: Secondary | ICD-10-CM

## 2018-06-13 DIAGNOSIS — M461 Sacroiliitis, not elsewhere classified: Secondary | ICD-10-CM

## 2018-06-13 DIAGNOSIS — Z87898 Personal history of other specified conditions: Secondary | ICD-10-CM

## 2018-06-13 DIAGNOSIS — R0789 Other chest pain: Secondary | ICD-10-CM

## 2018-06-13 DIAGNOSIS — J111 Influenza due to unidentified influenza virus with other respiratory manifestations: Secondary | ICD-10-CM

## 2018-06-13 MED ORDER — HYDROCODONE-ACETAMINOPHEN 10-325 MG PO TABS: 1.0000 | ORAL_TABLET | Freq: Three times a day (TID) | ORAL | 0 refills | Status: DC | PRN

## 2018-06-14 LAB — COMPREHENSIVE METABOLIC PANEL
ALT: 14 U/L (ref 0–35)
AST: 15 U/L (ref 0–37)
Albumin: 3.9 g/dL (ref 3.5–5.2)
Alkaline Phosphatase: 114 U/L (ref 39–117)
BUN: 15 mg/dL (ref 6–23)
CO2: 28 mEq/L (ref 19–32)
Calcium: 9 mg/dL (ref 8.4–10.5)
Chloride: 108 mEq/L (ref 96–112)
Creatinine, Ser: 0.59 mg/dL (ref 0.40–1.20)
GFR: 109.31 mL/min (ref >60.00)
Glucose, Bld: 90 mg/dL (ref 70–99)
Potassium: 4 mEq/L (ref 3.5–5.1)
Sodium: 143 mEq/L (ref 135–145)
Total Bilirubin: 0.3 mg/dL (ref 0.2–1.2)
Total Protein: 6.3 g/dL (ref 6.0–8.3)

## 2018-06-14 LAB — CBC WITH DIFFERENTIAL/PLATELET
Basophils Absolute: 0 10*3/uL (ref 0.0–0.1)
Basophils Relative: 0.6 % (ref 0.0–3.0)
Eosinophils Absolute: 0.2 10*3/uL (ref 0.0–0.7)
Eosinophils Relative: 3.5 % (ref 0.0–5.0)
HCT: 38.7 % (ref 36.0–46.0)
Hemoglobin: 12.9 g/dL (ref 12.0–15.0)
Lymphocytes Relative: 35.6 % (ref 12.0–46.0)
Lymphs Abs: 1.7 10*3/uL (ref 0.7–4.0)
MCHC: 33.3 g/dL (ref 30.0–36.0)
MCV: 86 fl (ref 78.0–100.0)
Monocytes Absolute: 0.5 10*3/uL (ref 0.1–1.0)
Monocytes Relative: 10.2 % (ref 3.0–12.0)
Neutro Abs: 2.5 10*3/uL (ref 1.4–7.7)
Neutrophils Relative %: 50.1 % (ref 43.0–77.0)
Platelets: 388 10*3/uL (ref 150.0–400.0)
RBC: 4.5 Mil/uL (ref 3.87–5.11)
RDW: 12.7 % (ref 11.5–15.5)
WBC: 4.9 10*3/uL (ref 4.0–10.5)

## 2018-06-14 LAB — HEMOGLOBIN A1C: Hgb A1c MFr Bld: 5.2 % (ref 4.6–6.5)

## 2018-06-14 LAB — VITAMIN B12: Vitamin B-12: >1500 pg/mL — ABNORMAL HIGH (ref 211–911)

## 2018-06-14 LAB — TSH: TSH: 0.72 u[IU]/mL (ref 0.35–4.50)

## 2018-06-14 LAB — VITAMIN D 25 HYDROXY (VIT D DEFICIENCY, FRACTURES): VITD: 18.5 ng/mL — ABNORMAL LOW (ref 30.00–100.00)

## 2018-06-14 NOTE — Telephone Encounter (Signed)
error 

## 2018-06-16 ENCOUNTER — Encounter: Payer: Self-pay | Admitting: Family Medicine

## 2018-06-17 LAB — IRON,TIBC AND FERRITIN PANEL
%SAT: 21 % (calc) (ref 16–45)
Ferritin: 149 ng/mL (ref 16–288)
Iron: 59 ug/dL (ref 45–160)
TIBC: 287 mcg/dL (calc) (ref 250–450)

## 2018-06-17 LAB — ZINC: Zinc: 54 ug/dL — ABNORMAL LOW (ref 60–130)

## 2018-06-17 LAB — VITAMIN B1: Vitamin B1 (Thiamine): 9 nmol/L (ref 8–30)

## 2018-06-17 LAB — CERULOPLASMIN: Ceruloplasmin: 31 mg/dL (ref 18–53)

## 2018-06-17 LAB — PTH, INTACT AND CALCIUM
Calcium: 9.1 mg/dL (ref 8.6–10.4)
PTH: 84 pg/mL — ABNORMAL HIGH (ref 14–64)

## 2018-06-18 ENCOUNTER — Telehealth: Payer: Self-pay | Admitting: Family Medicine

## 2018-06-18 NOTE — Telephone Encounter (Signed)
See note

## 2018-06-18 NOTE — Telephone Encounter (Signed)
Copied from CRM [id number]. Topic: Quick Communication - See Telephone Encounter >> Jun 18, 2018  8:13 AM Windy KalataMichael, Yuuki Skeens L, NT wrote: CRM for notification. See Telephone encounter for: 06/18/18.  Patient is calling and was given HYDROcodone-acetaminophen (NORCO) 10-325 MG tablet  on 06/13/18. She states she thought she was going to be put on Oxycodone but states she told Dr. Wendall MolaZwallace the wrong medication and has been sick on her stomach with the HYDROcodone-acetaminophen (NORCO) 10-325 MG tablet. She would like to know if this can be changed as well as getting something for nausea. Requesting a call back from Dr. Earlene PlaterWallace nurse.

## 2018-06-18 NOTE — Telephone Encounter (Signed)
Left message to return call to our office.  

## 2018-06-18 NOTE — Telephone Encounter (Signed)
Called patient mail box full ok to make changes?

## 2018-06-19 ENCOUNTER — Telehealth: Payer: Self-pay | Admitting: Family Medicine

## 2018-06-19 ENCOUNTER — Ambulatory Visit: Payer: BLUE CROSS/BLUE SHIELD | Admitting: Family Medicine

## 2018-06-19 MED ORDER — OXYCODONE-ACETAMINOPHEN 10-325 MG PO TABS: 1.0000 | ORAL_TABLET | Freq: Three times a day (TID) | ORAL | 0 refills | Status: DC | PRN

## 2018-06-19 NOTE — Telephone Encounter (Signed)
See note

## 2018-06-19 NOTE — Telephone Encounter (Signed)
Copied from CRM [id number]. Topic: General - Other >> Jun 19, 2018  4:43 PM Tamela OddiHarris, Elissia Spiewak J wrote: Reason for CRM: Patient called to request that the doctor send something in for her nausea.  She has been very sick on her stomach and would like Zofran or something else for the nausea.  Please and advise and call patient back at [phone]

## 2018-06-20 MED ORDER — ONDANSETRON 4 MG PO TBDP: 4.0000 mg | ORAL_TABLET | Freq: Three times a day (TID) | ORAL | 0 refills | Status: DC | PRN

## 2018-06-20 NOTE — Telephone Encounter (Signed)
Ok to send in.  

## 2018-06-20 NOTE — Telephone Encounter (Signed)
Zofran sent in. Please check on her tomorrow.

## 2018-06-20 NOTE — Telephone Encounter (Signed)
L/m to let patient  know that meds called in but box full will call and check on tomorrow.

## 2018-06-21 NOTE — Telephone Encounter (Signed)
Called pharmacy not picked up yet.

## 2018-06-21 NOTE — Telephone Encounter (Signed)
Box full not able to leave message.

## 2018-06-25 ENCOUNTER — Telehealth: Payer: Self-pay | Admitting: Family Medicine

## 2018-06-25 NOTE — Telephone Encounter (Signed)
Message received  CRM for notification. See Telephone encounter for: 06/25/18. Patient calling and would like a phone call from Dr Earlene PlaterWallace. Please advise. Did not go into detail about what it was regarding, just that it was an urgent matter to her.  Called patient not able to leave message

## 2018-06-25 NOTE — Telephone Encounter (Signed)
See note

## 2018-06-25 NOTE — Telephone Encounter (Signed)
Added to open message.  

## 2018-06-25 NOTE — Telephone Encounter (Signed)
Copied from CRM [id number]. Topic: Quick Communication - See Telephone Encounter >> Jun 25, 2018 12:33 PM Lorrine KinMcGee, Shawana Knoch B, NT wrote: CRM for notification. See Telephone encounter for: 06/25/18. Patient calling and would like a phone call from Dr Earlene PlaterWallace. Please advise. Did not go into detail about what it was regarding, just that it was an urgent matter to her.

## 2018-07-02 NOTE — Telephone Encounter (Signed)
Patient called back stated that her insurance is about to change and she did not want to loose Dr Earlene PlaterWallace as her primary care. She have few questions that she would like answered. Patient is also trying to reach her insurance provider to see if there is a way to get [facility] that is accepted by this office. Patient is awaiting a call back today if possible from Team Lead or the Practice Admin. Ph# [phone]

## 2018-07-02 NOTE — Telephone Encounter (Signed)
Called and left voicemail explaining to the patient that we do accept [employer] however, it is in her best interest in regards to billing if she ensures that the provider is in network. With the uncertainty of her upcoming insurance it is difficult to say if we will be in network I advised she should contact her insurance.   Please advise on other health questions she may have.

## 2018-07-03 NOTE — Telephone Encounter (Signed)
Not able to reach letter mailed.

## 2018-07-05 NOTE — Telephone Encounter (Signed)
Noted  

## 2018-07-13 NOTE — Progress Notes (Deleted)
Cardiology Office Note   Date:  07/13/2018   ID:  Valerie LoopSusan D Tena, DOB Oct 18, 1955, MRN [id number]  PCP:  Helane RimaWallace, Erica, DO  Cardiologist:   Scout Gumbs [place], MD   No chief complaint on file.     History of Present Illness: Valerie Ford is a 63 y.o. female who is seen at the request of Dr. Helane RimaErica Wallace for evaluation of chest pain. She has a history of GERD, obesity and OSA. Has not been on CPAP since she had bariatric surgery. She was seen by Dr. Harold HedgeKenneth Fath in December for evaluation of chest pain. Ecg reportedly showed some T wave abnormality. Functional evaluation with stress testing and Echo were recommended but not done.     Past Medical History:  Diagnosis Date  . ADHD 01/08/2018  . Fatty liver 01/07/2018  . GERD (gastroesophageal reflux disease) 01/07/2018  . History of bariatric surgery 01/07/2018  . History of cocaine use, remote, > 20 years 01/11/2018  . Migraine with aura and without status migrainosus, not intractable 01/11/2018  . Obstructive sleep apnea syndrome 10/20/2016  . Osteoarthritis of right knee 01/07/2018  . Sacroiliitis (HCC) 01/07/2018  . Seasonal allergic rhinitis due to pollen 01/11/2018  . Vitamin D deficiency 01/11/2018    Past Surgical History:  Procedure Laterality Date  . CESAREAN SECTION    . CYSTOSCOPY/URETEROSCOPY/HOLMIUM LASER/STENT PLACEMENT Left 05/15/2018   Procedure: CYSTOSCOPY/URETEROSCOPY/HOLMIUM LASER/STONE REMOVAL/STENT PLACEMENT;  Surgeon: Riki AltesStoioff, Scott C, MD;  Location: [facility] ORS;  Service: Urology;  Laterality: Left;  Marland Kitchen. GASTRIC ROUX-EN-Y       Current Outpatient Medications  Medication Sig Dispense Refill  . amphetamine-dextroamphetamine (ADDERALL) 10 MG tablet Take 5-10 mg by mouth See admin instructions. Take 1 tablet in the AM, 1/2 tablet mid-morning, and 1 tablet in the afternoon.  0  . celecoxib (CELEBREX) 200 MG capsule Take 200 mg by mouth daily.     . Cyanocobalamin (VITAMIN B-12) 5000 MCG TBDP Take 5,000 mcg by mouth daily.      . fexofenadine (ALLEGRA) 180 MG tablet Take 180 mg by mouth daily as needed for allergies.   2  . fluticasone (FLONASE) 50 MCG/ACT nasal spray Place 1 spray into both nostrils daily as needed for allergies or rhinitis.    . furosemide (LASIX) 20 MG tablet Take 20 mg by mouth daily as needed (swelling).     . Multiple Vitamin (MULTIVITAMIN WITH MINERALS) TABS tablet Take 1 tablet by mouth daily.    Marland Kitchen. omeprazole (PRILOSEC) 20 MG capsule Take 20 mg by mouth daily before breakfast.     . ondansetron (ZOFRAN ODT) 4 MG disintegrating tablet Take 1 tablet (4 mg total) by mouth every 8 (eight) hours as needed for nausea or vomiting. 20 tablet 0  . OVER THE COUNTER MEDICATION Take 3 drops by mouth 2 (two) times daily. CBD oil    . oxyCODONE-acetaminophen (PERCOCET) 10-325 MG tablet Take 1 tablet by mouth every 8 (eight) hours as needed for pain. 20 tablet 0  . potassium chloride (K-DUR) 10 MEQ tablet Take 10 mEq by mouth daily as needed (Takes with furosemide.).     Marland Kitchen. verapamil (CALAN-SR) 120 MG CR tablet Take 120 mg by mouth 2 (two) times daily.      No current facility-administered medications for this visit.     Allergies:   Sulfa antibiotics; Codeine; Iodinated diagnostic agents; Poison ivy extract; and Tramadol    Social History:  The patient  reports that she has quit smoking. She has never  used smokeless tobacco. She reports previous drug use. She reports that she does not drink alcohol.   Family History:  The patient's ***family history includes Alcohol abuse in her brother and sister; Arthritis in her maternal grandmother and mother; Asthma in her maternal grandmother and mother; Breast cancer (age of onset: 4255) in her maternal grandmother; COPD in her mother and sister; Depression in her brother and maternal grandmother; Drug abuse in her brother; Heart attack in her maternal grandmother; Heart disease in her father; High Cholesterol in her mother; Hypertension in her father and mother; Stroke  in her mother.    ROS:  Please see the history of present illness.   Otherwise, review of systems are positive for {NONE DEFAULTED:18576::"none"}.   All other systems are reviewed and negative.    PHYSICAL EXAM: VS:  LMP 03/17/2005 (Approximate)  , BMI There is no height or weight on file to calculate BMI. GEN: Well nourished, well developed, in no acute distress  HEENT: normal  Neck: no JVD, carotid bruits, or masses Cardiac: ***RRR; no murmurs, rubs, or gallops,no edema  Respiratory:  clear to auscultation bilaterally, normal work of breathing GI: soft, nontender, nondistended, + BS MS: no deformity or atrophy  Skin: warm and dry, no rash Neuro:  Strength and sensation are intact Psych: euthymic mood, full affect   EKG:  EKG {ACTION; IS/IS [id number]}OT:[id number]} ordered today. The ekg ordered today demonstrates ***   Recent Labs: 06/13/2018: ALT 14; BUN 15; Creatinine, Ser 0.59; Hemoglobin 12.9; Platelets 388.0; Potassium 4.0; Sodium 143; TSH 0.72    Lipid Panel No results found for: CHOL, TRIG, HDL, CHOLHDL, VLDL, LDLCALC, LDLDIRECT    Wt Readings from Last 3 Encounters:  06/13/18 166 lb 6.4 oz (75.5 kg)  06/11/18 165 lb 12.8 oz (75.2 kg)  05/26/18 165 lb (74.8 kg)    Dated 08/03/17: cholesterol 132, triglycerides 71, HDL 62, LDL 56.  Dated 06/13/18: A1c 5.2%. CBC, TSH, chemistries normal.  Other studies Reviewed: Additional studies/ records that were reviewed today include: ***. Review of the above records demonstrates: ***   ASSESSMENT AND PLAN:  1.  ***   Current medicines are reviewed at length with the patient today.  The patient {ACTIONS; HAS/DOES NOT HAVE:19233} concerns regarding medicines.  The following changes have been made:  {PLAN; NO CHANGE:13088:s}  Labs/ tests ordered today include: *** No orders of the defined types were placed in this encounter.    Disposition:   FU with *** in {gen number [id number]}0-10:310397} {Days to years:10300}  Signed, Admire Bunnell [place],  MD  07/13/2018 3:56 PM    [facility] [address], [place], [place], [id number] Phone [phone], Fax [phone]

## 2018-07-16 ENCOUNTER — Ambulatory Visit: Payer: BLUE CROSS/BLUE SHIELD | Admitting: Cardiology

## 2018-12-03 ENCOUNTER — Other Ambulatory Visit: Payer: Self-pay | Admitting: Urology

## 2018-12-03 DIAGNOSIS — Z87442 Personal history of urinary calculi: Secondary | ICD-10-CM

## 2018-12-14 ENCOUNTER — Ambulatory Visit: Payer: BLUE CROSS/BLUE SHIELD | Admitting: Urology

## 2019-02-22 ENCOUNTER — Encounter: Payer: Self-pay | Admitting: Family Medicine

## 2019-02-22 ENCOUNTER — Ambulatory Visit (INDEPENDENT_AMBULATORY_CARE_PROVIDER_SITE_OTHER): Payer: Self-pay | Admitting: Family Medicine

## 2019-02-22 VITALS — Temp 97.1°F | Ht 63.5 in

## 2019-02-22 DIAGNOSIS — J301 Allergic rhinitis due to pollen: Secondary | ICD-10-CM

## 2019-02-22 DIAGNOSIS — J0100 Acute maxillary sinusitis, unspecified: Secondary | ICD-10-CM

## 2019-02-22 MED ORDER — MONTELUKAST SODIUM 10 MG PO TABS: 10.0000 mg | ORAL_TABLET | Freq: Every day | ORAL | 3 refills | Status: DC

## 2019-02-22 MED ORDER — AMOXICILLIN 500 MG PO CAPS: 1000.0000 mg | ORAL_CAPSULE | Freq: Two times a day (BID) | ORAL | 0 refills | Status: DC

## 2019-02-22 NOTE — Patient Instructions (Signed)
If not improving after 48-72 hours.. make appt to be seen by urgent care.  IF SOB or worsening call for COVID19 testing.  Home isolate given cannot rule out COVID19.

## 2019-02-22 NOTE — Assessment & Plan Note (Signed)
Possible viral vs allergic sinusitis but given worsening > 2 weeks and unilateral face pain as well as limitations of virtual visit.Marland Kitchen will cover with antibiotics.  If not improving recommend in person exam and further consideration of COVID 19 testing ( pt refuses at this time but will isolate while having respiratory symptoms)

## 2019-02-22 NOTE — Progress Notes (Signed)
VIRTUAL VISIT Due to national recommendations of social distancing due to [place] 19, a virtual visit is felt to be most appropriate for this patient at this time.   I connected with the patient on 02/22/19 at  3:00 PM EDT by virtual telehealth platform and verified that I am speaking with the correct person using two identifiers.   I discussed the limitations, risks, security and privacy concerns of performing an evaluation and management service by  virtual telehealth platform and the availability of in person appointments. I also discussed with the patient that there may be a patient responsible charge related to this service. The patient expressed understanding and agreed to proceed.  Patient location: Home Provider Location: [place] Participants: Eliezer Lofts and Lyndle Herrlich   Chief Complaint  Patient presents with  . Nasal Congestion  . Headache  . Cough  . Fatigue    History of Present Illness: Cough This is a new problem. The current episode started 1 to 4 weeks ago (2 weeks restarted but off and on for last 3 months). The problem has been waxing and waning. The problem occurs constantly. The cough is productive of sputum. Associated symptoms include headaches, nasal congestion, postnasal drip, rhinorrhea and a sore throat. Pertinent negatives include no ear pain, fever, rash, shortness of breath or wheezing. Associated symptoms comments: Fatigue  facial pressure, worse on right side. The symptoms are aggravated by lying down. Treatments tried: mucinex.. had Zpack 3 motnhs ago that helped, using flonase, allegra. The treatment provided mild relief. Her past medical history is significant for environmental allergies. There is no history of asthma, COPD or pneumonia. former smoker, remote     COVID 19 screen No recent travel or known exposure to [place] The importance of social distancing was discussed today.   Review of Systems  Constitutional: Negative for fever.   HENT: Positive for postnasal drip, rhinorrhea and sore throat. Negative for ear pain.   Respiratory: Positive for cough. Negative for shortness of breath and wheezing.   Skin: Negative for rash.  Neurological: Positive for headaches.  Endo/Heme/Allergies: Positive for environmental allergies.      Past Medical History:  Diagnosis Date  . ADHD 01/08/2018  . Fatty liver 01/07/2018  . GERD (gastroesophageal reflux disease) 01/07/2018  . History of bariatric surgery 01/07/2018  . History of cocaine use, remote, > 20 years 01/11/2018  . Migraine with aura and without status migrainosus, not intractable 01/11/2018  . Obstructive sleep apnea syndrome 10/20/2016  . Osteoarthritis of right knee 01/07/2018  . Sacroiliitis ([place]) 01/07/2018  . Seasonal allergic rhinitis due to pollen 01/11/2018  . Vitamin D deficiency 01/11/2018    reports that she has quit smoking. She has never used smokeless tobacco. She reports previous drug use. She reports that she does not drink alcohol.   Current Outpatient Medications:  .  amphetamine-dextroamphetamine (ADDERALL) 10 MG tablet, Take 5-10 mg by mouth See admin instructions. Take 1 tablet in the AM, 1/2 tablet mid-morning, and 1 tablet in the afternoon., Disp: , Rfl: 0 .  celecoxib (CELEBREX) 200 MG capsule, Take 200 mg by mouth daily. , Disp: , Rfl:  .  Cyanocobalamin (VITAMIN B-12) 5000 MCG TBDP, Take 5,000 mcg by mouth daily., Disp: , Rfl:  .  fexofenadine (ALLEGRA) 180 MG tablet, Take 180 mg by mouth daily as needed for allergies. , Disp: , Rfl: 2 .  fluticasone (FLONASE) 50 MCG/ACT nasal spray, Place 1 spray into both nostrils daily as needed for allergies or  rhinitis., Disp: , Rfl:  .  furosemide (LASIX) 20 MG tablet, Take 20 mg by mouth daily as needed (swelling). , Disp: , Rfl:  .  Multiple Vitamin (MULTIVITAMIN WITH MINERALS) TABS tablet, Take 1 tablet by mouth daily., Disp: , Rfl:  .  omeprazole (PRILOSEC) 20 MG capsule, Take 20 mg by mouth daily before  breakfast. , Disp: , Rfl:  .  ondansetron (ZOFRAN ODT) 4 MG disintegrating tablet, Take 1 tablet (4 mg total) by mouth every 8 (eight) hours as needed for nausea or vomiting., Disp: 20 tablet, Rfl: 0 .  OVER THE COUNTER MEDICATION, Take 3 drops by mouth 2 (two) times daily. CBD oil, Disp: , Rfl:  .  oxyCODONE-acetaminophen (PERCOCET) 10-325 MG tablet, Take 1 tablet by mouth every 8 (eight) hours as needed for pain., Disp: 20 tablet, Rfl: 0 .  potassium chloride (K-DUR) 10 MEQ tablet, Take 10 mEq by mouth daily as needed (Takes with furosemide.). , Disp: , Rfl:  .  verapamil (CALAN-SR) 120 MG CR tablet, Take 120 mg by mouth 2 (two) times daily. , Disp: , Rfl:    Observations/Objective: Temperature (!) 97.1 F (36.2 C), temperature source Oral, height 5' 3.5" (1.613 m), last menstrual period 03/17/2005.  Physical Exam  Physical Exam Constitutional:      General: The patient is not in acute distress.Fatigued appearing TTp on tapping her own maxillary sinus on right Pulmonary:     Effort: Pulmonary effort is normal. No respiratory distress.  Neurological:     Mental Status: The patient is alert and oriented to person, place, and time.  Psychiatric:        Mood and Affect: Mood normal.        Behavior: Behavior normal.   Assessment and Plan    I discussed the assessment and treatment plan with the patient. The patient was provided an opportunity to ask questions and all were answered. The patient agreed with the plan and demonstrated an understanding of the instructions.   The patient was advised to call back or seek an in-person evaluation if the symptoms worsen or if the condition fails to improve as anticipated.     Kerby NoraAmy Bedsole, MD

## 2019-02-22 NOTE — Assessment & Plan Note (Signed)
Has had recurrent issue and poor control of allergies underlying current symptoms.  Despite allegra and flonase.  Will add Singulair at night.

## 2019-07-09 ENCOUNTER — Other Ambulatory Visit: Payer: Self-pay | Admitting: Family Medicine

## 2020-02-19 DIAGNOSIS — Z03818 Encounter for observation for suspected exposure to other biological agents ruled out: Secondary | ICD-10-CM | POA: Diagnosis not present

## 2020-02-19 DIAGNOSIS — Z20822 Contact with and (suspected) exposure to covid-19: Secondary | ICD-10-CM | POA: Diagnosis not present

## 2020-03-03 DIAGNOSIS — F902 Attention-deficit hyperactivity disorder, combined type: Secondary | ICD-10-CM | POA: Diagnosis not present

## 2020-03-03 DIAGNOSIS — F331 Major depressive disorder, recurrent, moderate: Secondary | ICD-10-CM | POA: Diagnosis not present

## 2020-03-03 DIAGNOSIS — F4310 Post-traumatic stress disorder, unspecified: Secondary | ICD-10-CM | POA: Diagnosis not present

## 2020-03-27 DIAGNOSIS — J069 Acute upper respiratory infection, unspecified: Secondary | ICD-10-CM | POA: Diagnosis not present

## 2020-03-27 DIAGNOSIS — Z20822 Contact with and (suspected) exposure to covid-19: Secondary | ICD-10-CM | POA: Diagnosis not present

## 2020-03-31 DIAGNOSIS — H539 Unspecified visual disturbance: Secondary | ICD-10-CM | POA: Diagnosis not present

## 2020-03-31 DIAGNOSIS — Z6826 Body mass index (BMI) 26.0-26.9, adult: Secondary | ICD-10-CM | POA: Diagnosis not present

## 2020-03-31 DIAGNOSIS — R42 Dizziness and giddiness: Secondary | ICD-10-CM | POA: Diagnosis not present

## 2020-03-31 DIAGNOSIS — G43109 Migraine with aura, not intractable, without status migrainosus: Secondary | ICD-10-CM | POA: Diagnosis not present

## 2020-03-31 DIAGNOSIS — G44221 Chronic tension-type headache, intractable: Secondary | ICD-10-CM | POA: Diagnosis not present

## 2020-05-04 ENCOUNTER — Ambulatory Visit (INDEPENDENT_AMBULATORY_CARE_PROVIDER_SITE_OTHER): Payer: PPO | Admitting: Physician Assistant

## 2020-05-04 ENCOUNTER — Encounter: Payer: Self-pay | Admitting: Physician Assistant

## 2020-05-04 ENCOUNTER — Other Ambulatory Visit: Payer: Self-pay

## 2020-05-04 VITALS — BP 130/80 | HR 62 | Temp 97.7°F | Ht 63.5 in | Wt 151.0 lb

## 2020-05-04 DIAGNOSIS — Z23 Encounter for immunization: Secondary | ICD-10-CM | POA: Diagnosis not present

## 2020-05-04 DIAGNOSIS — M461 Sacroiliitis, not elsewhere classified: Secondary | ICD-10-CM

## 2020-05-04 DIAGNOSIS — J42 Unspecified chronic bronchitis: Secondary | ICD-10-CM

## 2020-05-04 DIAGNOSIS — J329 Chronic sinusitis, unspecified: Secondary | ICD-10-CM | POA: Diagnosis not present

## 2020-05-04 DIAGNOSIS — J449 Chronic obstructive pulmonary disease, unspecified: Secondary | ICD-10-CM | POA: Insufficient documentation

## 2020-05-04 MED ORDER — HYDROCODONE-HOMATROPINE 5-1.5 MG/5ML PO SYRP: 5.0000 mL | ORAL_SOLUTION | Freq: Three times a day (TID) | ORAL | 0 refills | Status: DC | PRN

## 2020-05-04 MED ORDER — AZITHROMYCIN 250 MG PO TABS: ORAL_TABLET | ORAL | 0 refills | Status: DC

## 2020-05-04 NOTE — Patient Instructions (Signed)
It was great to see you!  Start the oral antibiotic.  Cough syrup as needed.  Use Spiriva as prescribed.  Referral for ENT placed today.  Let's follow-up in 3 months for a physical to update your blood work, sooner if you have concerns.  Take care,  Jarold Motto PA-C

## 2020-05-04 NOTE — Progress Notes (Signed)
Valerie Ford is a 64 y.o. female is here for transfer of care.  I acted as a Neurosurgeon for [employer], PA-C Valerie Mull, LPN   History of Present Illness:   Chief Complaint  Patient presents with  . Transfer of care  . Sinus Problem    HPI   Pt is here for transfer of care from Dr. Earlene Ford  Sinus problem Pt c/o nasal and chest congestion x 1 week. She is coughing and expectorating green sputum, some drainage from nose. Denies fever, chills, chest pain, SOB. She has had at least two sinus infections already this year, she is interested in being evaluated by ENT.   SI joint dysfunction of L side Was seeing an orthopedist in [place] but now that her insurance has changed she needs a new provider. Takes celebrex 200 capsule without relief of symptoms. Says she has a bone spur pressing on her sciatic nerve.  COPD Used to smoke and was diagnosed with COPD prior to stopping smoking. Has rx for Spiriva twice daily but does not take regularly. Also has albuterol prn.   Health Maintenance Due  Topic Date Due  . Hepatitis C Screening  Never done  . TETANUS/TDAP  Never done  . COLON CANCER SCREENING ANNUAL FOBT  Never done  . MAMMOGRAM  03/17/2017  . PAP SMEAR-Modifier  03/17/2018  . INFLUENZA VACCINE  Never done  . DEXA SCAN  Never done    Past Medical History:  Diagnosis Date  . ADHD 01/08/2018  . Fatty liver 01/07/2018  . GERD (gastroesophageal reflux disease) 01/07/2018  . History of bariatric surgery 01/07/2018  . History of cocaine use, remote, > 20 years 01/11/2018  . Migraine with aura and without status migrainosus, not intractable 01/11/2018  . Obstructive sleep apnea syndrome 10/20/2016  . Osteoarthritis of right knee 01/07/2018  . Sacroiliitis (HCC) 01/07/2018  . Seasonal allergic rhinitis due to pollen 01/11/2018  . Vitamin D deficiency 01/11/2018     Social History   Tobacco Use  . Smoking status: Former Games developer  . Smokeless tobacco: Never Used  Substance  Use Topics  . Alcohol use: No  . Drug use: Not Currently    Past Surgical History:  Procedure Laterality Date  . CESAREAN SECTION    . CYSTOSCOPY/URETEROSCOPY/HOLMIUM LASER/STENT PLACEMENT Left 05/15/2018   Procedure: CYSTOSCOPY/URETEROSCOPY/HOLMIUM LASER/STONE REMOVAL/STENT PLACEMENT;  Surgeon: Valerie Altes, MD;  Location: [facility] ORS;  Service: Urology;  Laterality: Left;  Marland Kitchen GASTRIC ROUX-EN-Y      Family History  Problem Relation Age of Onset  . Breast cancer Maternal Grandmother 102  . Arthritis Maternal Grandmother   . Asthma Maternal Grandmother   . Depression Maternal Grandmother   . Heart attack Maternal Grandmother   . Arthritis Mother   . Asthma Mother   . COPD Mother   . High Cholesterol Mother   . Hypertension Mother   . Stroke Mother   . Heart disease Father   . Hypertension Father   . Alcohol abuse Sister   . COPD Sister   . Alcohol abuse Brother   . Depression Brother   . Drug abuse Brother     PMHx, SurgHx, SocialHx, FamHx, Medications, and Allergies were reviewed in the Visit Navigator and updated as appropriate.   Patient Active Problem List   Diagnosis Date Noted  . COPD (chronic obstructive pulmonary disease) (HCC) 05/04/2020  . Migraine with typical aura 01/11/2018  . History of cocaine use, remote, > 20 years 01/11/2018  .  Seasonal allergic rhinitis due to pollen 01/11/2018  . Vitamin D deficiency 01/11/2018  . Obesity (BMI 30-39.9), s/p bariatric surgery 01/11/2018  . ADHD, followed by Psych, on low dose Adderall 01/08/2018  . Fatty liver 01/07/2018  . Status post bariatric surgery 01/07/2018  . GERD (gastroesophageal reflux disease) 01/07/2018  . Osteoarthritis of right knee 01/07/2018  . Sacroiliitis (HCC) 01/07/2018  . Obstructive sleep apnea syndrome, not on CPAP since weight loss 10/20/2016    Social History   Tobacco Use  . Smoking status: Former Games developer  . Smokeless tobacco: Never Used  Substance Use Topics  . Alcohol use: No  .  Drug use: Not Currently    Current Medications and Allergies:    Current Outpatient Medications:  .  amoxicillin (AMOXIL) 500 MG capsule, Take 2 capsules (1,000 mg total) by mouth 2 (two) times daily., Disp: 40 capsule, Rfl: 0 .  amphetamine-dextroamphetamine (ADDERALL) 10 MG tablet, Take 10 mg by mouth 2 (two) times daily with a meal. Take 1 tablet in the AM, 1/2 tablet mid-morning, and 1 tablet in the afternoon., Disp: , Rfl: 0 .  celecoxib (CELEBREX) 200 MG capsule, Take 200 mg by mouth daily. , Disp: , Rfl:  .  Cyanocobalamin (VITAMIN B-12) 5000 MCG TBDP, Take 5,000 mcg by mouth daily., Disp: , Rfl:  .  fexofenadine (ALLEGRA) 180 MG tablet, Take 180 mg by mouth daily as needed for allergies. , Disp: , Rfl: 2 .  fluticasone (FLONASE) 50 MCG/ACT nasal spray, Place 1 spray into both nostrils daily as needed for allergies or rhinitis., Disp: , Rfl:  .  furosemide (LASIX) 20 MG tablet, Take 20 mg by mouth daily as needed (swelling). , Disp: , Rfl:  .  Multiple Vitamin (MULTIVITAMIN WITH MINERALS) TABS tablet, Take 1 tablet by mouth daily., Disp: , Rfl:  .  omeprazole (PRILOSEC) 20 MG capsule, Take 20 mg by mouth daily before breakfast. , Disp: , Rfl:  .  ondansetron (ZOFRAN ODT) 4 MG disintegrating tablet, Take 1 tablet (4 mg total) by mouth every 8 (eight) hours as needed for nausea or vomiting., Disp: 20 tablet, Rfl: 0 .  oxyCODONE-acetaminophen (PERCOCET) 10-325 MG tablet, Take 1 tablet by mouth every 8 (eight) hours as needed for pain., Disp: 20 tablet, Rfl: 0 .  potassium chloride (K-DUR) 10 MEQ tablet, Take 10 mEq by mouth daily as needed (Takes with furosemide.). , Disp: , Rfl:  .  verapamil (CALAN-SR) 120 MG CR tablet, Take 120 mg by mouth 2 (two) times daily. , Disp: , Rfl:  .  azithromycin (ZITHROMAX Z-PAK) 250 MG tablet, Take 2 tablets ( total of 500 mg) PO on day 1, then 1 tablet ( total of 250 mg) PO q24 x 4 days., Disp: 6 each, Rfl: 0 .  HYDROcodone-homatropine (HYCODAN) 5-1.5  MG/5ML syrup, Take 5 mLs by mouth every 8 (eight) hours as needed for cough., Disp: 120 mL, Rfl: 0   Allergies  Allergen Reactions  . Sulfa Antibiotics Hives and Rash    Red and swollen ears  . Codeine Nausea And Vomiting  . Iodinated Diagnostic Agents Nausea And Vomiting    Nausea and vomiting after IV contast dye approximately 35 years ago.  . Poison Ivy Extract Rash    Severe rash  . Tramadol Nausea And Vomiting    Migraine     Review of Systems   ROS Negative unless otherwise specified per HPI.  Vitals:   Vitals:   05/04/20 0906  BP: 130/80  Pulse: 62  Temp: 97.7 F (36.5 C)  TempSrc: Temporal  SpO2: 96%  Weight: 151 lb (68.5 kg)  Height: 5' 3.5" (1.613 m)     Body mass index is 26.33 kg/m.   Physical Exam:    Physical Exam Vitals and nursing note reviewed.  Constitutional:      General: She is not in acute distress.    Appearance: She is well-developed. She is not ill-appearing or toxic-appearing.  HENT:     Head: Normocephalic and atraumatic.     Mouth/Throat:     Pharynx: Uvula midline. No posterior oropharyngeal erythema.  Eyes:     General: Lids are normal.     Conjunctiva/sclera: Conjunctivae normal.  Neck:     Trachea: Trachea normal.  Cardiovascular:     Rate and Rhythm: Normal rate and regular rhythm.     Heart sounds: Normal heart sounds, S1 normal and S2 normal.  Pulmonary:     Effort: Pulmonary effort is normal.     Breath sounds: Normal breath sounds. No decreased breath sounds, wheezing, rhonchi or rales.  Lymphadenopathy:     Cervical: No cervical adenopathy.  Skin:    General: Skin is warm and dry.  Neurological:     Mental Status: She is alert.  Psychiatric:        Speech: Speech normal.        Behavior: Behavior normal. Behavior is cooperative.      Assessment and Plan:    Laurren was seen today for transfer of care and sinus problem.  Diagnoses and all orders for this visit:  Recurrent sinus infections No red flags  on exam.  Will initiate azithromycin per orders. rx for cough syrup given as well. Sleepy precautions discussed re: cough syrup. Discussed taking medications as prescribed. Reviewed return precautions including worsening fever, SOB, worsening cough or other concerns. Push fluids and rest. I recommend that patient follow-up if symptoms worsen or persist despite treatment x 7-10 days, sooner if needed. -     Ambulatory referral to ENT  Sacroiliitis [facility]) Requesting new referral to orthopedist due to insurance reasons. I have placed this today. -     Ambulatory referral to Orthopedic Surgery  Chronic bronchitis, unspecified chronic bronchitis type (HCC) Discussed need to continue to use inhalers as prescribed. Start spiriva BID. Use albuterol prn. Follow-up if symptoms worsen or do not improve.  Other orders -     HYDROcodone-homatropine (HYCODAN) 5-1.5 MG/5ML syrup; Take 5 mLs by mouth every 8 (eight) hours as needed for cough. -     azithromycin (ZITHROMAX Z-PAK) 250 MG tablet; Take 2 tablets ( total of 500 mg) PO on day 1, then 1 tablet ( total of 250 mg) PO q24 x 4 days.   CMA or LPN served as scribe during this visit. History, Physical, and Plan performed by medical provider. The above documentation has been reviewed and is accurate and complete.   Jarold Motto, PA-C [place], [place] 05/04/2020  Follow-up: No follow-ups on file.

## 2020-05-20 DIAGNOSIS — J3489 Other specified disorders of nose and nasal sinuses: Secondary | ICD-10-CM | POA: Diagnosis not present

## 2020-05-20 DIAGNOSIS — J301 Allergic rhinitis due to pollen: Secondary | ICD-10-CM | POA: Diagnosis not present

## 2020-05-20 DIAGNOSIS — J328 Other chronic sinusitis: Secondary | ICD-10-CM | POA: Diagnosis not present

## 2020-05-26 DIAGNOSIS — M25562 Pain in left knee: Secondary | ICD-10-CM | POA: Diagnosis not present

## 2020-05-26 DIAGNOSIS — M1711 Unilateral primary osteoarthritis, right knee: Secondary | ICD-10-CM | POA: Diagnosis not present

## 2020-05-26 DIAGNOSIS — M533 Sacrococcygeal disorders, not elsewhere classified: Secondary | ICD-10-CM | POA: Diagnosis not present

## 2020-05-27 ENCOUNTER — Other Ambulatory Visit: Payer: Self-pay | Admitting: Internal Medicine

## 2020-05-27 DIAGNOSIS — J439 Emphysema, unspecified: Secondary | ICD-10-CM | POA: Diagnosis not present

## 2020-05-27 DIAGNOSIS — Z0000 Encounter for general adult medical examination without abnormal findings: Secondary | ICD-10-CM | POA: Diagnosis not present

## 2020-05-27 DIAGNOSIS — E162 Hypoglycemia, unspecified: Secondary | ICD-10-CM | POA: Insufficient documentation

## 2020-05-27 DIAGNOSIS — J431 Panlobular emphysema: Secondary | ICD-10-CM | POA: Diagnosis not present

## 2020-05-27 DIAGNOSIS — F902 Attention-deficit hyperactivity disorder, combined type: Secondary | ICD-10-CM | POA: Diagnosis not present

## 2020-05-27 DIAGNOSIS — Z1231 Encounter for screening mammogram for malignant neoplasm of breast: Secondary | ICD-10-CM | POA: Diagnosis not present

## 2020-05-27 DIAGNOSIS — Z1329 Encounter for screening for other suspected endocrine disorder: Secondary | ICD-10-CM | POA: Diagnosis not present

## 2020-05-27 DIAGNOSIS — Z79899 Other long term (current) drug therapy: Secondary | ICD-10-CM | POA: Diagnosis not present

## 2020-05-27 DIAGNOSIS — E559 Vitamin D deficiency, unspecified: Secondary | ICD-10-CM | POA: Diagnosis not present

## 2020-05-27 DIAGNOSIS — G43109 Migraine with aura, not intractable, without status migrainosus: Secondary | ICD-10-CM | POA: Diagnosis not present

## 2020-05-27 DIAGNOSIS — J449 Chronic obstructive pulmonary disease, unspecified: Secondary | ICD-10-CM | POA: Diagnosis not present

## 2020-05-27 DIAGNOSIS — Z1211 Encounter for screening for malignant neoplasm of colon: Secondary | ICD-10-CM | POA: Diagnosis not present

## 2020-05-27 DIAGNOSIS — K219 Gastro-esophageal reflux disease without esophagitis: Secondary | ICD-10-CM | POA: Diagnosis not present

## 2020-05-27 DIAGNOSIS — Z9884 Bariatric surgery status: Secondary | ICD-10-CM | POA: Diagnosis not present

## 2020-05-27 DIAGNOSIS — R1310 Dysphagia, unspecified: Secondary | ICD-10-CM | POA: Diagnosis not present

## 2020-05-28 DIAGNOSIS — J3489 Other specified disorders of nose and nasal sinuses: Secondary | ICD-10-CM | POA: Diagnosis not present

## 2020-06-08 DIAGNOSIS — J301 Allergic rhinitis due to pollen: Secondary | ICD-10-CM | POA: Diagnosis not present

## 2020-06-08 DIAGNOSIS — J3489 Other specified disorders of nose and nasal sinuses: Secondary | ICD-10-CM | POA: Diagnosis not present

## 2020-06-08 DIAGNOSIS — J320 Chronic maxillary sinusitis: Secondary | ICD-10-CM | POA: Diagnosis not present

## 2020-06-10 DIAGNOSIS — M25552 Pain in left hip: Secondary | ICD-10-CM | POA: Insufficient documentation

## 2020-06-10 DIAGNOSIS — M5416 Radiculopathy, lumbar region: Secondary | ICD-10-CM | POA: Diagnosis not present

## 2020-06-11 DIAGNOSIS — M533 Sacrococcygeal disorders, not elsewhere classified: Secondary | ICD-10-CM | POA: Insufficient documentation

## 2020-06-15 DIAGNOSIS — M461 Sacroiliitis, not elsewhere classified: Secondary | ICD-10-CM | POA: Diagnosis not present

## 2020-06-22 DIAGNOSIS — M5416 Radiculopathy, lumbar region: Secondary | ICD-10-CM | POA: Diagnosis not present

## 2020-06-22 DIAGNOSIS — M533 Sacrococcygeal disorders, not elsewhere classified: Secondary | ICD-10-CM | POA: Diagnosis not present

## 2020-06-24 DIAGNOSIS — M5416 Radiculopathy, lumbar region: Secondary | ICD-10-CM | POA: Diagnosis not present

## 2020-06-24 DIAGNOSIS — M25552 Pain in left hip: Secondary | ICD-10-CM | POA: Diagnosis not present

## 2020-07-01 DIAGNOSIS — M5416 Radiculopathy, lumbar region: Secondary | ICD-10-CM | POA: Diagnosis not present

## 2020-07-01 DIAGNOSIS — M25552 Pain in left hip: Secondary | ICD-10-CM | POA: Diagnosis not present

## 2020-07-12 ENCOUNTER — Emergency Department
Admission: EM | Admit: 2020-07-12 | Discharge: 2020-07-12 | Disposition: A | Discharge status: Home / Self Care | Payer: PPO | Attending: Emergency Medicine | Admitting: Emergency Medicine

## 2020-07-12 ENCOUNTER — Emergency Department: Payer: PPO

## 2020-07-12 ENCOUNTER — Encounter: Payer: Self-pay | Admitting: Emergency Medicine

## 2020-07-12 DIAGNOSIS — Z87891 Personal history of nicotine dependence: Secondary | ICD-10-CM | POA: Insufficient documentation

## 2020-07-12 DIAGNOSIS — I700 Atherosclerosis of aorta: Secondary | ICD-10-CM | POA: Diagnosis not present

## 2020-07-12 DIAGNOSIS — N134 Hydroureter: Secondary | ICD-10-CM | POA: Diagnosis not present

## 2020-07-12 DIAGNOSIS — N200 Calculus of kidney: Secondary | ICD-10-CM | POA: Diagnosis not present

## 2020-07-12 DIAGNOSIS — N132 Hydronephrosis with renal and ureteral calculous obstruction: Secondary | ICD-10-CM | POA: Diagnosis not present

## 2020-07-12 DIAGNOSIS — N23 Unspecified renal colic: Secondary | ICD-10-CM

## 2020-07-12 DIAGNOSIS — J449 Chronic obstructive pulmonary disease, unspecified: Secondary | ICD-10-CM | POA: Diagnosis not present

## 2020-07-12 DIAGNOSIS — R1031 Right lower quadrant pain: Secondary | ICD-10-CM | POA: Diagnosis not present

## 2020-07-12 DIAGNOSIS — B9629 Other Escherichia coli [E. coli] as the cause of diseases classified elsewhere: Secondary | ICD-10-CM | POA: Diagnosis not present

## 2020-07-12 LAB — COMPREHENSIVE METABOLIC PANEL
ALT: 37 U/L (ref 0–44)
AST: 32 U/L (ref 15–41)
Albumin: 4 g/dL (ref 3.5–5.0)
Alkaline Phosphatase: 101 U/L (ref 38–126)
Anion gap: 9 (ref 5–15)
BUN: 18 mg/dL (ref 8–23)
CO2: 28 mmol/L (ref 22–32)
Calcium: 8.9 mg/dL (ref 8.9–10.3)
Chloride: 104 mmol/L (ref 98–111)
Creatinine, Ser: 0.73 mg/dL (ref 0.44–1.00)
GFR, Estimated: >60 mL/min (ref >60)
Glucose, Bld: 122 mg/dL — ABNORMAL HIGH (ref 70–99)
Potassium: 3.5 mmol/L (ref 3.5–5.1)
Sodium: 141 mmol/L (ref 135–145)
Total Bilirubin: 0.6 mg/dL (ref 0.3–1.2)
Total Protein: 6.8 g/dL (ref 6.5–8.1)

## 2020-07-12 LAB — URINALYSIS, COMPLETE (UACMP) WITH MICROSCOPIC
Bacteria, UA: NONE SEEN
Bilirubin Urine: NEGATIVE
Glucose, UA: NEGATIVE mg/dL
Ketones, ur: 5 mg/dL — AB
Leukocytes,Ua: NEGATIVE
Nitrite: NEGATIVE
Protein, ur: 30 mg/dL — AB
RBC / HPF: >50 RBC/hpf — ABNORMAL HIGH (ref 0–5)
Specific Gravity, Urine: 1.02 (ref 1.005–1.030)
pH: 5 (ref 5.0–8.0)

## 2020-07-12 LAB — CBC
HCT: 40.3 % (ref 36.0–46.0)
Hemoglobin: 13.5 g/dL (ref 12.0–15.0)
MCH: 29.7 pg (ref 26.0–34.0)
MCHC: 33.5 g/dL (ref 30.0–36.0)
MCV: 88.8 fL (ref 80.0–100.0)
Platelets: 208 10*3/uL (ref 150–400)
RBC: 4.54 MIL/uL (ref 3.87–5.11)
RDW: 11.9 % (ref 11.5–15.5)
WBC: 4.7 10*3/uL (ref 4.0–10.5)
nRBC: 0 % (ref 0.0–0.2)

## 2020-07-12 LAB — LIPASE, BLOOD: Lipase: 30 U/L (ref 11–51)

## 2020-07-12 MED ORDER — LACTATED RINGERS IV BOLUS
1000.0000 mL | Freq: Once | INTRAVENOUS | Status: AC
  Administered 2020-07-12: 1000 mL via INTRAVENOUS

## 2020-07-12 MED ORDER — ACETAMINOPHEN 500 MG PO TABS
1000.0000 mg | ORAL_TABLET | Freq: Once | ORAL | Status: AC
  Administered 2020-07-12: 1000 mg via ORAL
  Filled 2020-07-12: qty 2

## 2020-07-12 MED ORDER — OXYCODONE HCL 5 MG PO TABS
5.0000 mg | ORAL_TABLET | Freq: Once | ORAL | Status: AC
Start: 2020-07-12 — End: 2020-07-12
  Administered 2020-07-12: 5 mg via ORAL
  Filled 2020-07-12: qty 1

## 2020-07-12 MED ORDER — KETOROLAC TROMETHAMINE 30 MG/ML IJ SOLN
15.0000 mg | Freq: Once | INTRAMUSCULAR | Status: AC
  Administered 2020-07-12: 15 mg via INTRAVENOUS
  Filled 2020-07-12: qty 1

## 2020-07-12 MED ORDER — ONDANSETRON HCL 4 MG/2ML IJ SOLN
4.0000 mg | Freq: Once | INTRAMUSCULAR | Status: AC
  Administered 2020-07-12: 4 mg via INTRAVENOUS
  Filled 2020-07-12: qty 2

## 2020-07-12 MED ORDER — OXYCODONE-ACETAMINOPHEN 5-325 MG PO TABS: 1.0000 | ORAL_TABLET | ORAL | 0 refills | Status: DC | PRN

## 2020-07-12 MED ORDER — ONDANSETRON 4 MG PO TBDP: 4.0000 mg | ORAL_TABLET | Freq: Three times a day (TID) | ORAL | 0 refills | Status: AC | PRN

## 2020-07-12 MED ORDER — IBUPROFEN 600 MG PO TABS: 600.0000 mg | ORAL_TABLET | Freq: Four times a day (QID) | ORAL | 0 refills | Status: DC | PRN

## 2020-07-12 MED ORDER — FENTANYL CITRATE (PF) 100 MCG/2ML IJ SOLN
50.0000 ug | Freq: Once | INTRAMUSCULAR | Status: AC
  Administered 2020-07-12: 50 ug via INTRAVENOUS
  Filled 2020-07-12: qty 2

## 2020-07-12 NOTE — ED Triage Notes (Signed)
Pt c/o sudden onset of right lower quadrant abdominal pain that started at approx 2330. Pt with hx/o kidney stones. Pt reports N/V.

## 2020-07-12 NOTE — ED Provider Notes (Signed)
[facility] Emergency Department Provider Note  ____________________________________________  Time seen: Approximately 3:49 AM  I have reviewed the triage vital signs and the nursing notes.   HISTORY  Chief Complaint No chief complaint on file.   HPI Valerie Ford is a 65 y.o. female with a history of kidney stones requiring stenting, sacroiliitis, obstructive sleep apnea, migraines, GERD, COPD who presents for evaluation of abdominal pain.  Patient reports sudden onset of severe sharp right lower quadrant abdominal pain associated with nausea and nonbloody nonbilious emesis.  Pain is similar to prior episodes of kidney stones.  No dysuria or hematuria, no chest pain or shortness of breath, no diarrhea or constipation, no fever or chills.  The pain is currently severe.   Past Medical History:  Diagnosis Date  . ADHD 01/08/2018  . Fatty liver 01/07/2018  . GERD (gastroesophageal reflux disease) 01/07/2018  . History of bariatric surgery 01/07/2018  . History of cocaine use, remote, > 20 years 01/11/2018  . Migraine with aura and without status migrainosus, not intractable 01/11/2018  . Obstructive sleep apnea syndrome 10/20/2016  . Osteoarthritis of right knee 01/07/2018  . Sacroiliitis (HCC) 01/07/2018  . Seasonal allergic rhinitis due to pollen 01/11/2018  . Vitamin D deficiency 01/11/2018    Patient Active Problem List   Diagnosis Date Noted  . COPD (chronic obstructive pulmonary disease) (HCC) 05/04/2020  . Migraine with typical aura 01/11/2018  . History of cocaine use, remote, > 20 years 01/11/2018  . Seasonal allergic rhinitis due to pollen 01/11/2018  . Vitamin D deficiency 01/11/2018  . Obesity (BMI 30-39.9), s/p bariatric surgery 01/11/2018  . ADHD, followed by Psych, on low dose Adderall 01/08/2018  . Fatty liver 01/07/2018  . Status post bariatric surgery 01/07/2018  . GERD (gastroesophageal reflux disease) 01/07/2018  . Osteoarthritis of right knee  01/07/2018  . Sacroiliitis (HCC) 01/07/2018  . Obstructive sleep apnea syndrome, not on CPAP since weight loss 10/20/2016    Past Surgical History:  Procedure Laterality Date  . CESAREAN SECTION    . CYSTOSCOPY/URETEROSCOPY/HOLMIUM LASER/STENT PLACEMENT Left 05/15/2018   Procedure: CYSTOSCOPY/URETEROSCOPY/HOLMIUM LASER/STONE REMOVAL/STENT PLACEMENT;  Surgeon: Riki Altes, MD;  Location: [facility] ORS;  Service: Urology;  Laterality: Left;  Marland Kitchen GASTRIC ROUX-EN-Y      Prior to Admission medications   Medication Sig Start Date End Date Taking? Authorizing Provider  ibuprofen (ADVIL) 600 MG tablet Take 1 tablet (600 mg total) by mouth every 6 (six) hours as needed. 07/12/20  Yes Don Perking, [place], [place]  ondansetron (ZOFRAN ODT) 4 MG disintegrating tablet Take 1 tablet (4 mg total) by mouth every 8 (eight) hours as needed. 07/12/20  Yes Don Perking, [place], [place]  oxyCODONE-acetaminophen (PERCOCET) 5-325 MG tablet Take 1 tablet by mouth every 4 (four) hours as needed. 07/12/20  Yes Alaijah Gibler, [place], MD  acarbose (PRECOSE) 25 MG tablet Take by mouth. 03/28/19   [provider]  amphetamine-dextroamphetamine (ADDERALL) 10 MG tablet Take 10 mg by mouth 2 (two) times daily with a meal. Take 1 tablet in the AM, 1/2 tablet mid-morning, and 1 tablet in the afternoon. 01/05/18   [provider]  azithromycin (ZITHROMAX Z-PAK) 250 MG tablet Take 2 tablets ( total of 500 mg) PO on day 1, then 1 tablet ( total of 250 mg) PO q24 x 4 days. 05/04/20   Jarold Motto, PA  baclofen (LIORESAL) 10 MG tablet Take 10 mg by mouth daily. 04/11/20   [provider]  celecoxib (CELEBREX) 200 MG capsule Take  200 mg by mouth daily.  01/31/17   [provider]  Cholecalciferol 25 MCG (1000 UT) tablet Take 1 tablet by mouth daily.    [provider]  Cyanocobalamin (VITAMIN B-12) 5000 MCG TBDP Take 5,000 mcg by mouth daily.    [provider]  fexofenadine (ALLEGRA) 180 MG tablet Take  180 mg by mouth daily as needed for allergies.  05/01/18   [provider]  FLUoxetine (PROZAC) 20 MG capsule Take 1 capsule by mouth daily. 04/24/19   [provider]  fluticasone (FLONASE) 50 MCG/ACT nasal spray Place 1 spray into both nostrils daily as needed for allergies or rhinitis.    [provider]  furosemide (LASIX) 20 MG tablet Take 20 mg by mouth daily as needed (swelling).     [provider]  HYDROcodone-homatropine (HYCODAN) 5-1.5 MG/5ML syrup Take 5 mLs by mouth every 8 (eight) hours as needed for cough. 05/04/20   Jarold Motto, PA  Multiple Vitamin (MULTIVITAMIN WITH MINERALS) TABS tablet Take 1 tablet by mouth daily.    [provider]  multivitamin (VIT W/EXTRA C) CHEW chewable tablet Chew by mouth.    [provider]  omeprazole (PRILOSEC) 20 MG capsule Take 20 mg by mouth daily before breakfast.     [provider]  potassium chloride (K-DUR) 10 MEQ tablet Take 10 mEq by mouth daily as needed (Takes with furosemide.).     [provider]  SPIRIVA HANDIHALER 18 MCG inhalation capsule 1 capsule daily. 03/27/20   [provider]  tiZANidine (ZANAFLEX) 4 MG capsule Take by mouth.    [provider]  traZODone (DESYREL) 100 MG tablet  04/06/20   [provider]  UNABLE TO FIND Med Name: Tresa Endo, daily    [provider]  verapamil (CALAN-SR) 120 MG CR tablet Take 120 mg by mouth 2 (two) times daily.  01/19/18   [provider]    Allergies Sulfa antibiotics, Codeine, Iodinated diagnostic agents, Poison ivy extract, and Tramadol  Family History  Problem Relation Age of Onset  . Breast cancer Maternal Grandmother 13  . Arthritis Maternal Grandmother   . Asthma Maternal Grandmother   . Depression Maternal Grandmother   . Heart attack Maternal Grandmother   . Arthritis Mother   . Asthma Mother   . COPD Mother   . High Cholesterol Mother   . Hypertension  Mother   . Stroke Mother   . Heart disease Father   . Hypertension Father   . Alcohol abuse Sister   . COPD Sister   . Alcohol abuse Brother   . Depression Brother   . Drug abuse Brother     Social History Social History   Tobacco Use  . Smoking status: Former Games developer  . Smokeless tobacco: Never Used  Substance Use Topics  . Alcohol use: No  . Drug use: Not Currently    Review of Systems  Constitutional: Negative for fever. Eyes: Negative for visual changes. ENT: Negative for sore throat. Neck: No neck pain  Cardiovascular: Negative for chest pain. Respiratory: Negative for shortness of breath. Gastrointestinal: + abdominal pain, nausea, and vomiting. No diarrhea. Genitourinary: Negative for dysuria. Musculoskeletal: Negative for back pain. Skin: Negative for rash. Neurological: Negative for headaches, weakness or numbness. Psych: No SI or HI  ____________________________________________   PHYSICAL EXAM:  VITAL SIGNS: ED Triage Vitals  Enc Vitals Group     BP 07/12/20 0123 (!) 149/99     Pulse Rate 07/12/20 0123 Marland Kitchen)  56     Resp 07/12/20 0123 (!) 22     Temp 07/12/20 0123 98.7 F (37.1 C)     Temp Source 07/12/20 0123 Oral     SpO2 07/12/20 0123 97 %     Weight 07/12/20 0123 140 lb (63.5 kg)     Height 07/12/20 0123 5\' 3"  (1.6 m)     Head Circumference --      Peak Flow --      Pain Score 07/12/20 0309 10     Pain Loc --      Pain Edu? --      Excl. in [place]? --     Constitutional: Alert and oriented, looks extremely uncomfortable due to pain.  HEENT:      Head: Normocephalic and atraumatic.         Eyes: Conjunctivae are normal. Sclera is non-icteric.       Mouth/Throat: Mucous membranes are moist.       Neck: Supple with no signs of meningismus. Cardiovascular: Regular rate and rhythm. No murmurs, gallops, or rubs. 2+ symmetrical distal pulses are present in all extremities. Respiratory: Normal respiratory effort. Lungs are clear to auscultation  bilaterally.  Gastrointestinal: Soft, mild right lower quadrant tenderness, and non distended with positive bowel sounds. No rebound or guarding. Genitourinary: No CVA tenderness. Musculoskeletal: No edema, cyanosis, or erythema of extremities. Neurologic: Normal speech and language. Face is symmetric. Moving all extremities. No gross focal neurologic deficits are appreciated. Skin: Skin is warm, dry and intact. No rash noted. Psychiatric: Mood and affect are normal. Speech and behavior are normal.  ____________________________________________   LABS (all labs ordered are listed, but only abnormal results are displayed)  Labs Reviewed  COMPREHENSIVE METABOLIC PANEL - Abnormal; Notable for the following components:      Result Value   Glucose, Bld 122 (*)    All other components within normal limits  URINALYSIS, COMPLETE (UACMP) WITH MICROSCOPIC - Abnormal; Notable for the following components:   Color, Urine YELLOW (*)    APPearance HAZY (*)    Hgb urine dipstick LARGE (*)    Ketones, ur 5 (*)    Protein, ur 30 (*)    RBC / HPF >50 (*)    All other components within normal limits  URINE CULTURE  LIPASE, BLOOD  CBC   ____________________________________________  EKG  none  ____________________________________________  RADIOLOGY  I have personally reviewed the images performed during this visit and I agree with the Radiologist's read.   Interpretation by Radiologist:  CT Renal Stone Study  Result Date: 07/12/2020 CLINICAL DATA:  Right-sided flank pain EXAM: CT ABDOMEN AND PELVIS WITHOUT CONTRAST TECHNIQUE: Multidetector CT imaging of the abdomen and pelvis was performed following the standard protocol without IV contrast. COMPARISON:  None. FINDINGS: Lower chest: The visualized heart size within normal limits. No pericardial fluid/thickening. No hiatal hernia. The visualized portions of the lungs are clear. Hepatobiliary: Although limited due to the lack of intravenous  contrast, normal in appearance without gross focal abnormality. No evidence of calcified gallstones or biliary ductal dilatation. Pancreas:  Unremarkable.  No surrounding inflammatory changes. Spleen: Normal in size. Although limited due to the lack of intravenous contrast, normal in appearance. Adrenals/Urinary Tract: Both adrenal glands appear normal. Mild to moderate right pelvicaliectasis and ureterectasis is seen to the proximal ureter where there is a 4 mm calculus present. Mild right-sided perinephric stranding is seen. No left-sided renal or collecting system calculi are noted. The bladder is partially decompressed and there  is question of a punctate calcification in the posterior right bladder. Stomach/Bowel: The stomach, small bowel, and colon are normal in appearance. No inflammatory changes or obstructive findings. appendix is normal. Vascular/Lymphatic: There are no enlarged abdominal or pelvic lymph nodes. Scattered aortic atherosclerotic calcifications are seen without aneurysmal dilatation. Reproductive: The uterus and adnexa are unremarkable. Other: No evidence of abdominal wall mass or hernia. Musculoskeletal: No acute or significant osseous findings. IMPRESSION: Mild to moderate right hydronephrosis to the mid right ureter where there is a 4 mm calculus present. Probable punctate calcification a posterior right bladder. Aortic Atherosclerosis (ICD10-I70.0). Electronically Signed   By: Jonna Clark M.D.   On: 07/12/2020 02:25     ____________________________________________   PROCEDURES  Procedure(s) performed: yes .1-3 Lead EKG Interpretation Performed by: Nita Sickle, MD Authorized by: Nita Sickle, MD     Interpretation: non-specific     ECG rate assessment: normal     Rhythm: sinus rhythm     Critical Care performed:  None ____________________________________________   INITIAL IMPRESSION / ASSESSMENT AND PLAN / ED COURSE   65 y.o. female with a history of  kidney stones requiring stenting, sacroiliitis, obstructive sleep apnea, migraines, GERD, COPD who presents for evaluation of sudden onset of right sided  abdominal pain radiating to the flank and associated with nausea and vomiting.  Patient looks extremely uncomfortable due to pain, abdomen is soft with mild right lower quadrant tenderness.  CT done in the waiting room was visualized by me showing a 4 mm ureteral stone with moderate right hydronephrosis, confirmed by radiology.  Will treat with IV fentanyl, IV Toradol, Zofran.  Will check UA to rule out superimposed UTI.  Labs no significant electrolyte derangements or leukocytosis.  Will monitor for pain control until patient is able to provide urine.  Old medical records reviewed.  History gathered from patient and her husband was at bedside, plan discussed with both of them.     _________________________ 5:22 AM on 07/12/2020 -----------------------------------------  Pain well controlled p.o. medications.  Patient tolerating p.o. with no further episodes of nausea and vomiting.  UA negative for UTI.  Will discharge home on ibuprofen, Percocet, Zofran, and referral back to her urologist.  Discussed my standard return precautions.  Plan discussed with patient and her husband.  _____________________________________________ Please note:  Patient was evaluated in Emergency Department today for the symptoms described in the history of present illness. Patient was evaluated in the context of the global COVID-19 pandemic, which necessitated consideration that the patient might be at risk for infection with the SARS-CoV-2 virus that causes COVID-19. Institutional protocols and algorithms that pertain to the evaluation of patients at risk for COVID-19 are in a state of rapid change based on information released by regulatory bodies including the CDC and federal and state organizations. These policies and algorithms were followed during the patient's care in  the ED.  Some ED evaluations and interventions may be delayed as a result of limited staffing during the pandemic.   [place] Controlled Substance Database was reviewed by me. ____________________________________________   FINAL CLINICAL IMPRESSION(S) / ED DIAGNOSES   Final diagnoses:  Renal colic  Kidney stone      NEW MEDICATIONS STARTED DURING THIS VISIT:  ED Discharge Orders         Ordered    ibuprofen (ADVIL) 600 MG tablet  Every 6 hours PRN        07/12/20 0522    oxyCODONE-acetaminophen (PERCOCET) 5-325 MG tablet  Every 4 hours PRN  07/12/20 0522    ondansetron (ZOFRAN ODT) 4 MG disintegrating tablet  Every 8 hours PRN        07/12/20 0522           Note:  This document was prepared using Dragon voice recognition software and may include unintentional dictation errors.    Don Perking, [place], MD 07/12/20 [phone]

## 2020-07-12 NOTE — ED Notes (Signed)
Pt placed on 2lpm via [place] to maintain oxygen saturation >90%. Pt currently satting at 95%.

## 2020-07-12 NOTE — Discharge Instructions (Signed)

## 2020-07-12 NOTE — ED Notes (Signed)
RN attempted to get IV access x2. RN attempted a 20G to the left [facility] and a 22G to the right wrist. RN unable to get IV access. RN was able to get blood return on each attempt, however the left AC infiltrated when RN was flushing line with NS and when RN was attempting the right wrist, pt was screaming and moving her arm. RN informed pt she needs to stay still, but pt states she is in pain. RN attempted to advance the catheter, but pt stated "pull it out." RN removed IV/catheter. All bleeding controlled, all catheters intact. Pt taken to CT by CT tech.

## 2020-07-14 LAB — URINE CULTURE: Culture: 60000 — AB

## 2020-07-15 DIAGNOSIS — J069 Acute upper respiratory infection, unspecified: Secondary | ICD-10-CM | POA: Diagnosis not present

## 2020-07-15 DIAGNOSIS — U071 COVID-19: Secondary | ICD-10-CM | POA: Diagnosis not present

## 2020-07-15 NOTE — Progress Notes (Signed)
ED Antimicrobial Stewardship Positive Culture Follow Up   Valerie Ford is an 65 y.o. female who presented to [facility] on 07/12/2020 with a chief complaint of pain associated with renal colic/kidney stones.  Recent Results (from the past 720 hour(s))  Urine Culture     Status: Abnormal   Collection Time: 07/12/20  1:24 AM   Specimen: Urine, Random  Result Value Ref Range Status   Specimen Description   Final    URINE, RANDOM Performed at [facility], [address] [place], [place] [address]    Special Requests   Final    NONE Performed at [facility], [address] [place], [place] [address]    Culture (A)  Final    60,000 COLONIES/mL ESCHERICHIA COLI 60,000 COLONIES/mL DIPHTHEROIDS(CORYNEBACTERIUM SPECIES) Standardized susceptibility testing for this organism is not available. Performed at [facility], [address], [place], [place] [address]    Report Status 07/14/2020 FINAL  Final   Organism ID, Bacteria ESCHERICHIA COLI (A)  Final      Susceptibility   Escherichia coli - MIC*    AMPICILLIN <=2 SENSITIVE Sensitive     CEFAZOLIN <=4 SENSITIVE Sensitive     CEFEPIME <=0.12 SENSITIVE Sensitive     CEFTRIAXONE <=0.25 SENSITIVE Sensitive     CIPROFLOXACIN <=0.25 SENSITIVE Sensitive     GENTAMICIN <=1 SENSITIVE Sensitive     IMIPENEM <=0.25 SENSITIVE Sensitive     NITROFURANTOIN <=16 SENSITIVE Sensitive     TRIMETH/SULFA <=20 SENSITIVE Sensitive     AMPICILLIN/SULBACTAM <=2 SENSITIVE Sensitive     PIP/TAZO <=4 SENSITIVE Sensitive     * 60,000 COLONIES/mL ESCHERICHIA COLI    [x]  Patient discharged originally without antimicrobial agent and treatment is now indicated. UCx mixed: Ecoli & Corynebacterium each <60k CFUs. Ecoli Pan-S. Patient has history of Sulfa Allergy.  New antibiotic prescription: Keflex 500mg  TID x7days **Notified patient of change and answered questions regarding drug. **called Rx to Patient's preferred pharmacy [facility] [phone]).    ED Provider: [id number], MD   ([phone] ,PHARMD, BCPS Clinical Pharmacist  07/15/2020, 12:43 PM

## 2020-07-21 ENCOUNTER — Other Ambulatory Visit: Payer: Self-pay | Admitting: Urology

## 2020-07-21 DIAGNOSIS — N201 Calculus of ureter: Secondary | ICD-10-CM

## 2020-07-22 ENCOUNTER — Telehealth: Payer: Self-pay | Admitting: Urology

## 2020-07-22 NOTE — Telephone Encounter (Signed)
App made and patient has been notified

## 2020-07-22 NOTE — Telephone Encounter (Signed)
-----   Message from Riki Altes, MD sent at 07/21/2020  2:27 PM EST ----- Regarding: Follow-up appointment Please schedule follow-up appointment with me this week or next week-seen in ED with stone.  KUB prior.  Order was entered

## 2020-07-24 ENCOUNTER — Ambulatory Visit
Admission: RE | Admit: 2020-07-24 | Discharge: 2020-07-24 | Disposition: A | Discharge status: Home / Self Care | Payer: PPO | Source: Ambulatory Visit | Attending: Urology | Admitting: Urology

## 2020-07-24 ENCOUNTER — Encounter: Payer: Self-pay | Admitting: Urology

## 2020-07-24 ENCOUNTER — Other Ambulatory Visit: Payer: Self-pay

## 2020-07-24 ENCOUNTER — Ambulatory Visit (INDEPENDENT_AMBULATORY_CARE_PROVIDER_SITE_OTHER): Payer: PPO | Admitting: Urology

## 2020-07-24 VITALS — BP 113/68 | HR 62 | Ht 63.0 in | Wt 143.5 lb

## 2020-07-24 DIAGNOSIS — N201 Calculus of ureter: Secondary | ICD-10-CM | POA: Diagnosis not present

## 2020-07-24 DIAGNOSIS — N1330 Unspecified hydronephrosis: Secondary | ICD-10-CM

## 2020-07-24 DIAGNOSIS — N200 Calculus of kidney: Secondary | ICD-10-CM

## 2020-07-24 NOTE — Progress Notes (Signed)
07/24/2020 11:52 AM   Valerie Ford [birth year] [id number]  Referring provider: [name], MD [address] [facility] [place],  [place] [address]  Chief Complaint  Patient presents with  . Nephrolithiasis    Urologic history: 1. recurrent stone disease -Ureteroscopic removal left mid ureteral calculus 05/2018 -Stone analysis CaOxMono/CaOxDi/CaPhos 20/70/10 -Passed stone 20+ years prior   HPI: 65 y.o. female with a history of recurrent stone disease presents for an acute care follow-up   [facility] ED visit 07/12/2020 with sudden onset right lower quadrant abdominal pain associated with nausea/vomiting similar to prior episodes renal colic  Stone protocol CT with a 4 mm right proximal ureteral calculus with moderate hydronephrosis/hydroureter  She received parenteral analgesics, antiemetics and Toradol with pain control  Discharged on oral analgesics, ibuprofen and oxycodone  Intermittent pain for 2 to 3 days after ED discharge however has been pain-free since that time  Not aware of passing a stone   PMH: Past Medical History:  Diagnosis Date  . ADHD 01/08/2018  . Fatty liver 01/07/2018  . GERD (gastroesophageal reflux disease) 01/07/2018  . History of bariatric surgery 01/07/2018  . History of cocaine use, remote, > 20 years 01/11/2018  . Migraine with aura and without status migrainosus, not intractable 01/11/2018  . Obstructive sleep apnea syndrome 10/20/2016  . Osteoarthritis of right knee 01/07/2018  . Sacroiliitis (HCC) 01/07/2018  . Seasonal allergic rhinitis due to pollen 01/11/2018  . Vitamin D deficiency 01/11/2018    Surgical History: Past Surgical History:  Procedure Laterality Date  . CESAREAN SECTION    . CYSTOSCOPY/URETEROSCOPY/HOLMIUM LASER/STENT PLACEMENT Left 05/15/2018   Procedure: CYSTOSCOPY/URETEROSCOPY/HOLMIUM LASER/STONE REMOVAL/STENT PLACEMENT;  Surgeon: Riki Altes, MD;  Location: [facility] ORS;  Service: Urology;  Laterality: Left;   Marland Kitchen GASTRIC ROUX-EN-Y      Home Medications:  Allergies as of 07/24/2020      Reactions   Gadolinium    Other reaction(s): Vomiting   Sulfa Antibiotics Hives, Rash   Red and swollen ears   Codeine Nausea And Vomiting   Iodinated Diagnostic Agents Nausea And Vomiting   Nausea and vomiting after IV contast dye approximately 35 years ago.   Poison Ivy Extract Rash   Severe rash   Tramadol Nausea And Vomiting   Migraine      Medication List       Accurate as of July 24, 2020 11:52 AM. If you have any questions, ask your nurse or doctor.        acarbose 25 MG tablet Commonly known as: PRECOSE Take by mouth.   amphetamine-dextroamphetamine 10 MG tablet Commonly known as: ADDERALL Take 10 mg by mouth 2 (two) times daily with a meal. Take 1 tablet in the AM, 1/2 tablet mid-morning, and 1 tablet in the afternoon.   azithromycin 250 MG tablet Commonly known as: Zithromax Z-Pak Take 2 tablets ( total of 500 mg) PO on day 1, then 1 tablet ( total of 250 mg) PO q24 x 4 days.   baclofen 10 MG tablet Commonly known as: LIORESAL Take 10 mg by mouth daily.   celecoxib 200 MG capsule Commonly known as: CELEBREX Take 200 mg by mouth daily.   Cholecalciferol 25 MCG (1000 UT) tablet Take 1 tablet by mouth daily.   fexofenadine 180 MG tablet Commonly known as: ALLEGRA Take 180 mg by mouth daily as needed for allergies.   FLUoxetine 20 MG capsule Commonly known as: PROZAC Take 1 capsule by mouth daily.   fluticasone 50  MCG/ACT nasal spray Commonly known as: FLONASE Place 1 spray into both nostrils daily as needed for allergies or rhinitis.   furosemide 20 MG tablet Commonly known as: LASIX Take 20 mg by mouth daily as needed (swelling).   HYDROcodone-homatropine 5-1.5 MG/5ML syrup Commonly known as: HYCODAN Take 5 mLs by mouth every 8 (eight) hours as needed for cough.   ibuprofen 600 MG tablet Commonly known as: ADVIL Take 1 tablet (600 mg total) by mouth every 6  (six) hours as needed.   multivitamin Chew chewable tablet Chew by mouth.   multivitamin with minerals Tabs tablet Take 1 tablet by mouth daily.   omeprazole 20 MG capsule Commonly known as: PRILOSEC Take 20 mg by mouth daily before breakfast.   ondansetron 4 MG disintegrating tablet Commonly known as: Zofran ODT Take 1 tablet (4 mg total) by mouth every 8 (eight) hours as needed.   oxyCODONE-acetaminophen 5-325 MG tablet Commonly known as: Percocet Take 1 tablet by mouth every 4 (four) hours as needed.   potassium chloride 10 MEQ tablet Commonly known as: KLOR-CON Take 10 mEq by mouth daily as needed (Takes with furosemide.).   Spiriva HandiHaler 18 MCG inhalation capsule Generic drug: tiotropium 1 capsule daily.   tiZANidine 4 MG capsule Commonly known as: ZANAFLEX Take by mouth.   traZODone 100 MG tablet Commonly known as: DESYREL   UNABLE TO FIND Med Name: Mineral Rich, daily   verapamil 120 MG CR tablet Commonly known as: CALAN-SR Take 120 mg by mouth 2 (two) times daily.   Vitamin B-12 5000 MCG Tbdp Take 5,000 mcg by mouth daily.       Allergies:  Allergies  Allergen Reactions  . Gadolinium     Other reaction(s): Vomiting  . Sulfa Antibiotics Hives and Rash    Red and swollen ears  . Codeine Nausea And Vomiting  . Iodinated Diagnostic Agents Nausea And Vomiting    Nausea and vomiting after IV contast dye approximately 35 years ago.  . Poison Ivy Extract Rash    Severe rash  . Tramadol Nausea And Vomiting    Migraine     Family History: Family History  Problem Relation Age of Onset  . Breast cancer Maternal Grandmother 16  . Arthritis Maternal Grandmother   . Asthma Maternal Grandmother   . Depression Maternal Grandmother   . Heart attack Maternal Grandmother   . Arthritis Mother   . Asthma Mother   . COPD Mother   . High Cholesterol Mother   . Hypertension Mother   . Stroke Mother   . Heart disease Father   . Hypertension Father    . Alcohol abuse Sister   . COPD Sister   . Alcohol abuse Brother   . Depression Brother   . Drug abuse Brother     Social History:  reports that she has quit smoking. She has never used smokeless tobacco. She reports previous drug use. She reports that she does not drink alcohol.   Physical Exam: BP 113/68 (BP Location: Left Arm, Patient Position: Sitting, Cuff Size: Normal)   Pulse 62   Ht 5\' 3"  (1.6 m)   Wt 143 lb 8 oz (65.1 kg)   LMP 03/17/2005 (Approximate)   BMI 25.42 kg/m   Constitutional:  Alert and oriented, No acute distress. HEENT: [place] AT, moist mucus membranes.  Trachea midline, no masses. Cardiovascular: No clubbing, cyanosis, or edema. Respiratory: Normal respiratory effort, no increased work of breathing. Skin: No rashes, bruises or suspicious lesions. Neurologic: Grossly  intact, no focal deficits, moving all 4 extremities. Psychiatric: Normal mood and affect.   Pertinent Imaging: KUB performed today was personally reviewed and interpreted and there are no calcifications identified along the expected course of the right ureter.  CT 08/01/2020 was personally viewed and interpreted  CT Renal Stone Study  Narrative CLINICAL DATA:  Right-sided flank pain  EXAM: CT ABDOMEN AND PELVIS WITHOUT CONTRAST  TECHNIQUE: Multidetector CT imaging of the abdomen and pelvis was performed following the standard protocol without IV contrast.  COMPARISON:  None.  FINDINGS: Lower chest: The visualized heart size within normal limits. No pericardial fluid/thickening.  No hiatal hernia.  The visualized portions of the lungs are clear.  Hepatobiliary: Although limited due to the lack of intravenous contrast, normal in appearance without gross focal abnormality. No evidence of calcified gallstones or biliary ductal dilatation.  Pancreas:  Unremarkable.  No surrounding inflammatory changes.  Spleen: Normal in size. Although limited due to the lack of intravenous  contrast, normal in appearance.  Adrenals/Urinary Tract: Both adrenal glands appear normal. Mild to moderate right pelvicaliectasis and ureterectasis is seen to the proximal ureter where there is a 4 mm calculus present. Mild right-sided perinephric stranding is seen. No left-sided renal or collecting system calculi are noted. The bladder is partially decompressed and there is question of a punctate calcification in the posterior right bladder.  Stomach/Bowel: The stomach, small bowel, and colon are normal in appearance. No inflammatory changes or obstructive findings. appendix is normal.  Vascular/Lymphatic: There are no enlarged abdominal or pelvic lymph nodes. Scattered aortic atherosclerotic calcifications are seen without aneurysmal dilatation.  Reproductive: The uterus and adnexa are unremarkable.  Other: No evidence of abdominal wall mass or hernia.  Musculoskeletal: No acute or significant osseous findings.  IMPRESSION: Mild to moderate right hydronephrosis to the mid right ureter where there is a 4 mm calculus present.  Probable punctate calcification a posterior right bladder.  Aortic Atherosclerosis (ICD10-I70.0).   Electronically Signed By: Jonna Clark M.D. On: 07/12/2020 02:25   Assessment & Plan:    65 y.o. female with recent ED visit for right renal colic secondary to a 4 mm proximal ureteral calculus.  Pain resolved 2-3 days after her ED visit and she most likely has passed the stone which is not visualized on KUB today  Follow-up renal ultrasound to document resolution or persistence of right hydronephrosis  We discussed metabolic evaluation however she has elected to continue general stone prevention guidelines   Riki Altes, MD  [facility] [address] [place], [place] [address] [phone]

## 2020-07-24 NOTE — H&P (View-Only) (Signed)
07/24/2020 11:52 AM   Valerie Ford [birth year] [id number]  Referring provider: [name], MD [address] [facility] [place],  [place] [address]  Chief Complaint  Patient presents with  . Nephrolithiasis    Urologic history: 1. recurrent stone disease -Ureteroscopic removal left mid ureteral calculus 05/2018 -Stone analysis CaOxMono/CaOxDi/CaPhos 20/70/10 -Passed stone 20+ years prior   HPI: 65 y.o. female with a history of recurrent stone disease presents for an acute care follow-up   [facility] ED visit 07/12/2020 with sudden onset right lower quadrant abdominal pain associated with nausea/vomiting similar to prior episodes renal colic  Stone protocol CT with a 4 mm right proximal ureteral calculus with moderate hydronephrosis/hydroureter  She received parenteral analgesics, antiemetics and Toradol with pain control  Discharged on oral analgesics, ibuprofen and oxycodone  Intermittent pain for 2 to 3 days after ED discharge however has been pain-free since that time  Not aware of passing a stone   PMH: Past Medical History:  Diagnosis Date  . ADHD 01/08/2018  . Fatty liver 01/07/2018  . GERD (gastroesophageal reflux disease) 01/07/2018  . History of bariatric surgery 01/07/2018  . History of cocaine use, remote, > 20 years 01/11/2018  . Migraine with aura and without status migrainosus, not intractable 01/11/2018  . Obstructive sleep apnea syndrome 10/20/2016  . Osteoarthritis of right knee 01/07/2018  . Sacroiliitis (HCC) 01/07/2018  . Seasonal allergic rhinitis due to pollen 01/11/2018  . Vitamin D deficiency 01/11/2018    Surgical History: Past Surgical History:  Procedure Laterality Date  . CESAREAN SECTION    . CYSTOSCOPY/URETEROSCOPY/HOLMIUM LASER/STENT PLACEMENT Left 05/15/2018   Procedure: CYSTOSCOPY/URETEROSCOPY/HOLMIUM LASER/STONE REMOVAL/STENT PLACEMENT;  Surgeon: Riki Altes, MD;  Location: [facility] ORS;  Service: Urology;  Laterality: Left;   Marland Kitchen GASTRIC ROUX-EN-Y      Home Medications:  Allergies as of 07/24/2020      Reactions   Gadolinium    Other reaction(s): Vomiting   Sulfa Antibiotics Hives, Rash   Red and swollen ears   Codeine Nausea And Vomiting   Iodinated Diagnostic Agents Nausea And Vomiting   Nausea and vomiting after IV contast dye approximately 35 years ago.   Poison Ivy Extract Rash   Severe rash   Tramadol Nausea And Vomiting   Migraine      Medication List       Accurate as of July 24, 2020 11:52 AM. If you have any questions, ask your nurse or doctor.        acarbose 25 MG tablet Commonly known as: PRECOSE Take by mouth.   amphetamine-dextroamphetamine 10 MG tablet Commonly known as: ADDERALL Take 10 mg by mouth 2 (two) times daily with a meal. Take 1 tablet in the AM, 1/2 tablet mid-morning, and 1 tablet in the afternoon.   azithromycin 250 MG tablet Commonly known as: Zithromax Z-Pak Take 2 tablets ( total of 500 mg) PO on day 1, then 1 tablet ( total of 250 mg) PO q24 x 4 days.   baclofen 10 MG tablet Commonly known as: LIORESAL Take 10 mg by mouth daily.   celecoxib 200 MG capsule Commonly known as: CELEBREX Take 200 mg by mouth daily.   Cholecalciferol 25 MCG (1000 UT) tablet Take 1 tablet by mouth daily.   fexofenadine 180 MG tablet Commonly known as: ALLEGRA Take 180 mg by mouth daily as needed for allergies.   FLUoxetine 20 MG capsule Commonly known as: PROZAC Take 1 capsule by mouth daily.   fluticasone 50  MCG/ACT nasal spray Commonly known as: FLONASE Place 1 spray into both nostrils daily as needed for allergies or rhinitis.   furosemide 20 MG tablet Commonly known as: LASIX Take 20 mg by mouth daily as needed (swelling).   HYDROcodone-homatropine 5-1.5 MG/5ML syrup Commonly known as: HYCODAN Take 5 mLs by mouth every 8 (eight) hours as needed for cough.   ibuprofen 600 MG tablet Commonly known as: ADVIL Take 1 tablet (600 mg total) by mouth every 6  (six) hours as needed.   multivitamin Chew chewable tablet Chew by mouth.   multivitamin with minerals Tabs tablet Take 1 tablet by mouth daily.   omeprazole 20 MG capsule Commonly known as: PRILOSEC Take 20 mg by mouth daily before breakfast.   ondansetron 4 MG disintegrating tablet Commonly known as: Zofran ODT Take 1 tablet (4 mg total) by mouth every 8 (eight) hours as needed.   oxyCODONE-acetaminophen 5-325 MG tablet Commonly known as: Percocet Take 1 tablet by mouth every 4 (four) hours as needed.   potassium chloride 10 MEQ tablet Commonly known as: KLOR-CON Take 10 mEq by mouth daily as needed (Takes with furosemide.).   Spiriva HandiHaler 18 MCG inhalation capsule Generic drug: tiotropium 1 capsule daily.   tiZANidine 4 MG capsule Commonly known as: ZANAFLEX Take by mouth.   traZODone 100 MG tablet Commonly known as: DESYREL   UNABLE TO FIND Med Name: Mineral Rich, daily   verapamil 120 MG CR tablet Commonly known as: CALAN-SR Take 120 mg by mouth 2 (two) times daily.   Vitamin B-12 5000 MCG Tbdp Take 5,000 mcg by mouth daily.       Allergies:  Allergies  Allergen Reactions  . Gadolinium     Other reaction(s): Vomiting  . Sulfa Antibiotics Hives and Rash    Red and swollen ears  . Codeine Nausea And Vomiting  . Iodinated Diagnostic Agents Nausea And Vomiting    Nausea and vomiting after IV contast dye approximately 35 years ago.  . Poison Ivy Extract Rash    Severe rash  . Tramadol Nausea And Vomiting    Migraine     Family History: Family History  Problem Relation Age of Onset  . Breast cancer Maternal Grandmother 55  . Arthritis Maternal Grandmother   . Asthma Maternal Grandmother   . Depression Maternal Grandmother   . Heart attack Maternal Grandmother   . Arthritis Mother   . Asthma Mother   . COPD Mother   . High Cholesterol Mother   . Hypertension Mother   . Stroke Mother   . Heart disease Father   . Hypertension Father    . Alcohol abuse Sister   . COPD Sister   . Alcohol abuse Brother   . Depression Brother   . Drug abuse Brother     Social History:  reports that she has quit smoking. She has never used smokeless tobacco. She reports previous drug use. She reports that she does not drink alcohol.   Physical Exam: BP 113/68 (BP Location: Left Arm, Patient Position: Sitting, Cuff Size: Normal)   Pulse 62   Ht 5' 3" (1.6 m)   Wt 143 lb 8 oz (65.1 kg)   LMP 03/17/2005 (Approximate)   BMI 25.42 kg/m   Constitutional:  Alert and oriented, No acute distress. HEENT: [place] AT, moist mucus membranes.  Trachea midline, no masses. Cardiovascular: No clubbing, cyanosis, or edema. Respiratory: Normal respiratory effort, no increased work of breathing. Skin: No rashes, bruises or suspicious lesions. Neurologic: Grossly   intact, no focal deficits, moving all 4 extremities. Psychiatric: Normal mood and affect.   Pertinent Imaging: KUB performed today was personally reviewed and interpreted and there are no calcifications identified along the expected course of the right ureter.  CT 08/01/2020 was personally viewed and interpreted  CT Renal Stone Study  Narrative CLINICAL DATA:  Right-sided flank pain  EXAM: CT ABDOMEN AND PELVIS WITHOUT CONTRAST  TECHNIQUE: Multidetector CT imaging of the abdomen and pelvis was performed following the standard protocol without IV contrast.  COMPARISON:  None.  FINDINGS: Lower chest: The visualized heart size within normal limits. No pericardial fluid/thickening.  No hiatal hernia.  The visualized portions of the lungs are clear.  Hepatobiliary: Although limited due to the lack of intravenous contrast, normal in appearance without gross focal abnormality. No evidence of calcified gallstones or biliary ductal dilatation.  Pancreas:  Unremarkable.  No surrounding inflammatory changes.  Spleen: Normal in size. Although limited due to the lack of intravenous  contrast, normal in appearance.  Adrenals/Urinary Tract: Both adrenal glands appear normal. Mild to moderate right pelvicaliectasis and ureterectasis is seen to the proximal ureter where there is a 4 mm calculus present. Mild right-sided perinephric stranding is seen. No left-sided renal or collecting system calculi are noted. The bladder is partially decompressed and there is question of a punctate calcification in the posterior right bladder.  Stomach/Bowel: The stomach, small bowel, and colon are normal in appearance. No inflammatory changes or obstructive findings. appendix is normal.  Vascular/Lymphatic: There are no enlarged abdominal or pelvic lymph nodes. Scattered aortic atherosclerotic calcifications are seen without aneurysmal dilatation.  Reproductive: The uterus and adnexa are unremarkable.  Other: No evidence of abdominal wall mass or hernia.  Musculoskeletal: No acute or significant osseous findings.  IMPRESSION: Mild to moderate right hydronephrosis to the mid right ureter where there is a 4 mm calculus present.  Probable punctate calcification a posterior right bladder.  Aortic Atherosclerosis (ICD10-I70.0).   Electronically Signed By: Jonna Clark M.D. On: 07/12/2020 02:25   Assessment & Plan:    65 y.o. female with recent ED visit for right renal colic secondary to a 4 mm proximal ureteral calculus.  Pain resolved 2-3 days after her ED visit and she most likely has passed the stone which is not visualized on KUB today  Follow-up renal ultrasound to document resolution or persistence of right hydronephrosis  We discussed metabolic evaluation however she has elected to continue general stone prevention guidelines   Riki Altes, MD  [facility] [address] [place], [place] [address] [phone]

## 2020-07-24 NOTE — Patient Instructions (Signed)
Dietary Guidelines to Help Prevent Kidney Stones Kidney stones are deposits of minerals and salts that form inside your kidneys. Your risk of developing kidney stones may be greater depending on your diet, your lifestyle, the medicines you take, and whether you have certain medical conditions. Most people can lower their chances of developing kidney stones by following the instructions below. Your dietitian may give you more specific instructions depending on your overall health and the type of kidney stones you tend to develop. What are tips for following this plan? Reading food labels  Choose foods with "no salt added" or "low-salt" labels. Limit your salt (sodium) intake to less than 1,500 mg a day.  Choose foods with calcium for each meal and snack. Try to eat about 300 mg of calcium at each meal. Foods that contain 200-500 mg of calcium a serving include: ? 8 oz (237 mL) of milk, calcium-fortifiednon-dairy milk, and calcium-fortifiedfruit juice. Calcium-fortified means that calcium has been added to these drinks. ? 8 oz (237 mL) of kefir, yogurt, and soy yogurt. ? 4 oz (114 g) of tofu. ? 1 oz (28 g) of cheese. ? 1 cup (150 g) of dried figs. ? 1 cup (91 g) of cooked broccoli. ? One 3 oz (85 g) can of sardines or mackerel. Most people need 1,000-1,500 mg of calcium a day. Talk to your dietitian about how much calcium is recommended for you.   Shopping  Buy plenty of fresh fruits and vegetables. Most people do not need to avoid fruits and vegetables, even if these foods contain nutrients that may contribute to kidney stones.  When shopping for convenience foods, choose: ? Whole pieces of fruit. ? Pre-made salads with dressing on the side. ? Low-fat fruit and yogurt smoothies.  Avoid buying frozen meals or prepared deli foods. These can be high in sodium.  Look for foods with live cultures, such as yogurt and kefir.  Choose high-fiber grains, such as whole-wheat breads, oat bran, and  wheat cereals. Cooking  Do not add salt to food when cooking. Place a salt shaker on the table and allow each person to add his or her own salt to taste.  Use vegetable protein, such as beans, textured vegetable protein (TVP), or tofu, instead of meat in pasta, casseroles, and soups. Meal planning  Eat less salt, if told by your dietitian. To do this: ? Avoid eating processed or pre-made food. ? Avoid eating fast food.  Eat less animal protein, including cheese, meat, poultry, or fish, if told by your dietitian. To do this: ? Limit the number of times you have meat, poultry, fish, or cheese each week. Eat a diet free of meat at least 2 days a week. ? Eat only one serving each day of meat, poultry, fish, or seafood. ? When you prepare animal protein, cut pieces into small portion sizes. For most meat and fish, one serving is about the size of the palm of your hand.  Eat at least five servings of fresh fruits and vegetables each day. To do this: ? Keep fruits and vegetables on hand for snacks. ? Eat one piece of fruit or a handful of berries with breakfast. ? Have a salad and fruit at lunch. ? Have two kinds of vegetables at dinner.  Limit foods that are high in a substance called oxalate. These include: ? Spinach (cooked), rhubarb, beets, sweet potatoes, and Swiss chard. ? Peanuts. ? Potato chips, french fries, and baked potatoes with skin on. ? Nuts and   nut products. ? Chocolate.  If you regularly take a diuretic medicine, make sure to eat at least 1 or 2 servings of fruits or vegetables that are high in potassium each day. These include: ? Avocado. ? Banana. ? Orange, prune, carrot, or tomato juice. ? Baked potato. ? Cabbage. ? Beans and split peas. Lifestyle  Drink enough fluid to keep your urine pale yellow. This is the most important thing you can do. Spread your fluid intake throughout the day.  If you drink alcohol: ? Limit how much you use to:  0-1 drink a day for  women who are not pregnant.  0-2 drinks a day for men. ? Be aware of how much alcohol is in your drink. In the [place] one drink equals one 12 oz bottle of beer (355 mL), one 5 oz glass of wine (148 mL), or one 1 oz glass of hard liquor (44 mL).  Lose weight if told by your health care provider. Work with your dietitian to find an eating plan and weight loss strategies that work best for you.   General information  Talk to your health care provider and dietitian about taking daily supplements. You may be told the following depending on your health and the cause of your kidney stones: ? Not to take supplements with vitamin C. ? To take a calcium supplement. ? To take a daily probiotic supplement. ? To take other supplements such as magnesium, fish oil, or vitamin B6.  Take over-the-counter and prescription medicines only as told by your health care provider. These include supplements. What foods should I limit? Limit your intake of the following foods, or eat them as told by your dietitian. Vegetables Spinach. Rhubarb. Beets. Canned vegetables. Pickles. Olives. Baked potatoes with skin. Grains Wheat bran. Baked goods. Salted crackers. Cereals high in sugar. Meats and other proteins Nuts. Nut butters. Large portions of meat, poultry, or fish. Salted, precooked, or cured meats, such as sausages, meat loaves, and hot dogs. Dairy Cheese. Beverages Regular soft drinks. Regular vegetable juice. Seasonings and condiments Seasoning blends with salt. Salad dressings. Soy sauce. Ketchup. Barbecue sauce. Other foods Canned soups. Canned pasta sauce. Casseroles. Pizza. Lasagna. Frozen meals. Potato chips. French fries. The items listed above may not be a complete list of foods and beverages you should limit. Contact a dietitian for more information. What foods should I avoid? Talk to your dietitian about specific foods you should avoid based on the type of kidney stones you have and your overall  health. Fruits Grapefruit. The item listed above may not be a complete list of foods and beverages you should avoid. Contact a dietitian for more information. Summary  Kidney stones are deposits of minerals and salts that form inside your kidneys.  You can lower your risk of kidney stones by making changes to your diet.  The most important thing you can do is drink enough fluid. Drink enough fluid to keep your urine pale yellow.  Talk to your dietitian about how much calcium you should have each day, and eat less salt and animal protein as told by your dietitian. This information is not intended to replace advice given to you by your health care provider. Make sure you discuss any questions you have with your health care provider. Document Revised: 06/20/2019 Document Reviewed: 06/20/2019 Elsevier Patient Education  2021 Else[employer] Inc.  

## 2020-07-25 ENCOUNTER — Encounter: Payer: Self-pay | Admitting: Urology

## 2020-07-30 DIAGNOSIS — J320 Chronic maxillary sinusitis: Secondary | ICD-10-CM | POA: Diagnosis not present

## 2020-07-30 DIAGNOSIS — J321 Chronic frontal sinusitis: Secondary | ICD-10-CM | POA: Diagnosis not present

## 2020-08-04 ENCOUNTER — Encounter: Payer: PPO | Admitting: Physician Assistant

## 2020-08-05 DIAGNOSIS — J328 Other chronic sinusitis: Secondary | ICD-10-CM | POA: Diagnosis not present

## 2020-08-05 DIAGNOSIS — R519 Headache, unspecified: Secondary | ICD-10-CM | POA: Diagnosis not present

## 2020-08-10 ENCOUNTER — Ambulatory Visit
Admission: RE | Admit: 2020-08-10 | Discharge: 2020-08-10 | Disposition: A | Discharge status: Home / Self Care | Payer: PPO | Source: Ambulatory Visit | Attending: Urology | Admitting: Urology

## 2020-08-10 ENCOUNTER — Other Ambulatory Visit: Payer: Self-pay

## 2020-08-10 DIAGNOSIS — N200 Calculus of kidney: Secondary | ICD-10-CM

## 2020-08-10 DIAGNOSIS — N1330 Unspecified hydronephrosis: Secondary | ICD-10-CM | POA: Insufficient documentation

## 2020-08-11 ENCOUNTER — Ambulatory Visit: Payer: PPO | Admitting: Physician Assistant

## 2020-08-11 ENCOUNTER — Encounter: Payer: Self-pay | Admitting: Physician Assistant

## 2020-08-11 ENCOUNTER — Other Ambulatory Visit: Payer: Self-pay | Admitting: Radiology

## 2020-08-11 VITALS — BP 142/81 | HR 67 | Temp 98.1°F | Ht 63.0 in | Wt 137.0 lb

## 2020-08-11 DIAGNOSIS — R42 Dizziness and giddiness: Secondary | ICD-10-CM | POA: Diagnosis not present

## 2020-08-11 DIAGNOSIS — N3001 Acute cystitis with hematuria: Secondary | ICD-10-CM | POA: Diagnosis not present

## 2020-08-11 DIAGNOSIS — G44221 Chronic tension-type headache, intractable: Secondary | ICD-10-CM | POA: Diagnosis not present

## 2020-08-11 DIAGNOSIS — N201 Calculus of ureter: Secondary | ICD-10-CM

## 2020-08-11 DIAGNOSIS — N200 Calculus of kidney: Secondary | ICD-10-CM | POA: Diagnosis not present

## 2020-08-11 DIAGNOSIS — H539 Unspecified visual disturbance: Secondary | ICD-10-CM | POA: Diagnosis not present

## 2020-08-11 DIAGNOSIS — G43119 Migraine with aura, intractable, without status migrainosus: Secondary | ICD-10-CM | POA: Diagnosis not present

## 2020-08-11 LAB — URINALYSIS, COMPLETE
Bilirubin, UA: NEGATIVE
Glucose, UA: NEGATIVE
Nitrite, UA: POSITIVE — AB
Specific Gravity, UA: >1.03 — ABNORMAL HIGH (ref 1.005–1.030)
Urobilinogen, Ur: 0.2 mg/dL (ref 0.2–1.0)
pH, UA: 6 (ref 5.0–7.5)

## 2020-08-11 LAB — MICROSCOPIC EXAMINATION: RBC, Urine: >30 /hpf — AB (ref 0–2)

## 2020-08-11 MED ORDER — TAMSULOSIN HCL 0.4 MG PO CAPS: 0.4000 mg | ORAL_CAPSULE | Freq: Every day | ORAL | 0 refills | Status: DC

## 2020-08-11 MED ORDER — OXYCODONE-ACETAMINOPHEN 5-325 MG PO TABS: 1.0000 | ORAL_TABLET | Freq: Four times a day (QID) | ORAL | 0 refills | Status: DC | PRN

## 2020-08-11 NOTE — Patient Instructions (Addendum)
1. Start Flomax every day to help you pass the stone. 2. Take Zofran and Percocet as needed to help with nausea and pain. 3. Continue Levaquin. 4. We will plan for a ureteroscopy with Dr. Lonna Cobb next Tuesday. Our office will call you on Thursday to check in on you. 5. If you develop uncontrollable pain, uncontrollable nausea or vomiting, or a new fever, please call our office IMMEDIATELY or go to the Emergency Department if it is outside of office hours (8a-5p Monday-Friday).  Laser Therapy for Kidney Stones Laser therapy for kidney stones is a procedure to break up small, hard mineral deposits that form in the kidney (kidney stones). The procedure is done using a device that produces a focused beam of light (laser). The laser breaks up kidney stones into pieces that are small enough to be passed out of the body through urination or removed from the body during the procedure. You may need laser therapy if you have kidney stones that are painful or block your urinary tract. This procedure is done by inserting a tube (ureteroscope) into your kidney through the urethral opening. The urethra is the part of the body that drains urine from the bladder. In women, the urethra opens above the vaginal opening. In men, the urethra opens at the tip of the penis. The ureteroscope is inserted through the urethra, and surgical instruments are moved through the bladder and the muscular tube that connects the kidney to the bladder (ureter) until they reach the kidney. Tell a health care provider about:  Any allergies you have.  All medicines you are taking, including vitamins, herbs, eye drops, creams, and over-the-counter medicines.  Any problems you or family members have had with anesthetic medicines.  Any blood disorders you have.  Any surgeries you have had.  Any medical conditions you have.  Whether you are pregnant or may be pregnant. What are the risks? Generally, this is a safe procedure. However,  problems may occur, including:  Infection.  Bleeding.  Allergic reactions to medicines.  Damage to the urethra, bladder, or ureter.  Urinary tract infection (UTI).  Narrowing of the urethra (urethral stricture).  Difficulty passing urine.  Blockage of the kidney caused by a fragment of kidney stone. What happens before the procedure? Medicines  Ask your health care provider about: ? Changing or stopping your regular medicines. This is especially important if you are taking diabetes medicines or blood thinners. ? Taking medicines such as aspirin and ibuprofen. These medicines can thin your blood. Do not take these medicines unless your health care provider tells you to take them. ? Taking over-the-counter medicines, vitamins, herbs, and supplements. Eating and drinking Follow instructions from your health care provider about eating and drinking, which may include:  8 hours before the procedure - stop eating heavy meals or foods, such as meat, fried foods, or fatty foods.  6 hours before the procedure - stop eating light meals or foods, such as toast or cereal.  6 hours before the procedure - stop drinking milk or drinks that contain milk.  2 hours before the procedure - stop drinking clear liquids. Staying hydrated Follow instructions from your health care provider about hydration, which may include:  Up to 2 hours before the procedure - you may continue to drink clear liquids, such as water, clear fruit juice, black coffee, and plain tea.   General instructions  You may have a physical exam before the procedure. You may also have tests, such as imaging tests and  blood or urine tests.  If your ureter is too narrow, your health care provider may place a soft, flexible tube (stent) inside of it. The stent may be placed days or weeks before your laser therapy procedure.  Plan to have someone take you home from the hospital or clinic.  If you will be going home right after  the procedure, plan to have someone stay with you for 24 hours.  Do not use any products that contain nicotine or tobacco for at least 4 weeks before the procedure. These products include cigarettes, e-cigarettes, and chewing tobacco. If you need help quitting, ask your health care provider.  Ask your health care provider: ? How your surgical site will be marked or identified. ? What steps will be taken to help prevent infection. These may include:  Removing hair at the surgery site.  Washing skin with a germ-killing soap.  Taking antibiotic medicine. What happens during the procedure?  An IV will be inserted into one of your veins.  You will be given one or more of the following: ? A medicine to help you relax (sedative). ? A medicine to numb the area (local anesthetic). ? A medicine to make you fall asleep (general anesthetic).  A ureteroscope will be inserted into your urethra. The ureteroscope will send images to a video screen in the operating room to guide your surgeon to the area of your kidney that will be treated.  A small, flexible tube will be threaded through the ureteroscope and into your bladder and ureter, up to your kidney.  The laser device will be inserted into your kidney through the tube. Your surgeon will pulse the laser on and off to break up kidney stones.  A surgical instrument that has a tiny wire basket may be inserted through the tube into your kidney to remove the pieces of broken kidney stone. The procedure may vary among health care providers and hospitals.   What happens after the procedure?  Your blood pressure, heart rate, breathing rate, and blood oxygen level will be monitored until you leave the hospital or clinic.  You will be given pain medicine as needed.  You may continue to receive antibiotics.  You may have a stent temporarily placed in your ureter.  Do not drive for 24 hours if you were given a sedative during your procedure.  You  may be given a strainer to collect any stone fragments that you pass in your urine. Your health care provider may have these tested. Summary  Laser therapy for kidney stones is a procedure to break up kidney stones into pieces that are small enough to be passed out of the body through urination or removed during the procedure.  Follow instructions from your health care provider about eating and drinking before the procedure.  During the procedure, the ureteroscope will send images to a video screen to guide your surgeon to the area of your kidney that will be treated.  Do not drive for 24 hours if you were given a sedative during your procedure. This information is not intended to replace advice given to you by your health care provider. Make sure you discuss any questions you have with your health care provider. Document Revised: 03/08/2018 Document Reviewed: 03/08/2018 Elsevier Patient Education  2021 [employer].

## 2020-08-11 NOTE — Progress Notes (Signed)
08/11/2020 10:50 AM   Valerie Ford Jun 30, 1956 [id number]  CC: Chief Complaint  Patient presents with  . Results   HPI: Valerie Ford is a 65 y.o. female with PMH recurrent nephrolithiasis who presents today for follow-up of an acute stone episode associated with a 4 mm proximal right ureteral stone.  Today she reports her right flank pain suddenly resumed yesterday afternoon.  She took Tylenol with minimal improvement in her symptoms.  She denies recent Azo use.  She states she is also been having dysuria and difficulty urinating.  Renal ultrasound performed yesterday reveals persistent moderate right hydronephrosis with bilateral ureteral jets visualized.  Stone was not previously seen on KUB dated 07/24/2020.  In-office UA today positive for trace ketones, 3+ blood, 2+ protein, nitrites, and 1+ leukocyte esterase; urine microscopy with 6-10 WBCs/HPF, >30 RBCs/HPF, and calcium oxalate crystals.   She is currently taking Levaquin 500 mg daily following her recent sinuplasty.  She has 2 doses of this medication remaining.  She has a history of bariatric surgery and takes Celebrex 200 mg daily.  PMH: Past Medical History:  Diagnosis Date  . ADHD 01/08/2018  . Fatty liver 01/07/2018  . GERD (gastroesophageal reflux disease) 01/07/2018  . History of bariatric surgery 01/07/2018  . History of cocaine use, remote, > 20 years 01/11/2018  . Migraine with aura and without status migrainosus, not intractable 01/11/2018  . Obstructive sleep apnea syndrome 10/20/2016  . Osteoarthritis of right knee 01/07/2018  . Sacroiliitis (HCC) 01/07/2018  . Seasonal allergic rhinitis due to pollen 01/11/2018  . Vitamin D deficiency 01/11/2018    Surgical History: Past Surgical History:  Procedure Laterality Date  . CESAREAN SECTION    . CYSTOSCOPY/URETEROSCOPY/HOLMIUM LASER/STENT PLACEMENT Left 05/15/2018   Procedure: CYSTOSCOPY/URETEROSCOPY/HOLMIUM LASER/STONE REMOVAL/STENT PLACEMENT;  Surgeon: Riki Altes, MD;  Location: [facility] ORS;  Service: Urology;  Laterality: Left;  Marland Kitchen GASTRIC ROUX-EN-Y      Home Medications:  Allergies as of 08/11/2020      Reactions   Gadolinium    Other reaction(s): Vomiting   Sulfa Antibiotics Hives, Rash   Red and swollen ears   Codeine Nausea And Vomiting   Iodinated Diagnostic Agents Nausea And Vomiting   Nausea and vomiting after IV contast dye approximately 35 years ago.   Poison Ivy Extract Rash   Severe rash   Tramadol Nausea And Vomiting   Migraine      Medication List       Accurate as of August 11, 2020 10:50 AM. If you have any questions, ask your nurse or doctor.        amphetamine-dextroamphetamine 10 MG tablet Commonly known as: ADDERALL Take 10 mg by mouth 2 (two) times daily with a meal. Take 1 tablet in the AM, 1/2 tablet mid-morning, and 1 tablet in the afternoon.   baclofen 10 MG tablet Commonly known as: LIORESAL Take 10 mg by mouth daily.   celecoxib 200 MG capsule Commonly known as: CELEBREX Take 200 mg by mouth daily.   cephALEXin 500 MG capsule Commonly known as: KEFLEX TAKE 1 CAPSULE BY MOUTH 3 TIMES DAILY FOR 7 DAYS   cholecalciferol 25 MCG (1000 UNIT) tablet Commonly known as: VITAMIN D cholecalciferol (vit D3) 1,000 unit-vitamin K2 (MK4) 100 mcg tablet   COLLAGEN PO daily.   fexofenadine 180 MG tablet Commonly known as: ALLEGRA Take 180 mg by mouth daily as needed for allergies.   FLUoxetine 20 MG capsule Commonly known as: PROZAC Take 1 capsule by  mouth daily.   fluticasone 50 MCG/ACT nasal spray Commonly known as: FLONASE Place 1 spray into both nostrils daily as needed for allergies or rhinitis.   furosemide 20 MG tablet Commonly known as: LASIX Take 20 mg by mouth daily as needed (swelling).   ketorolac 10 MG tablet Commonly known as: TORADOL Take 10 mg by mouth every 6 (six) hours as needed.   methocarbamol 750 MG tablet Commonly known as: ROBAXIN Take by mouth at bedtime.   multivitamin  Chew chewable tablet Chew by mouth.   omeprazole 40 MG capsule Commonly known as: PRILOSEC Take 40 mg by mouth daily.   ondansetron 4 MG disintegrating tablet Commonly known as: Zofran ODT Take 1 tablet (4 mg total) by mouth every 8 (eight) hours as needed.   oxyCODONE-acetaminophen 5-325 MG tablet Commonly known as: Percocet Take 1 tablet by mouth every 4 (four) hours as needed.   potassium chloride 10 MEQ tablet Commonly known as: KLOR-CON Take 10 mEq by mouth daily as needed (Takes with furosemide.).   Spiriva HandiHaler 18 MCG inhalation capsule Generic drug: tiotropium 1 capsule daily.   tiZANidine 4 MG capsule Commonly known as: ZANAFLEX Take by mouth.   traMADol 50 MG tablet Commonly known as: ULTRAM Take 50 mg by mouth every 6 (six) hours as needed.   traZODone 100 MG tablet Commonly known as: DESYREL   UNABLE TO FIND Med Name: Mineral Rich, daily   verapamil 80 MG tablet Commonly known as: CALAN Take 80 mg by mouth 2 (two) times daily.       Allergies:  Allergies  Allergen Reactions  . Gadolinium     Other reaction(s): Vomiting  . Sulfa Antibiotics Hives and Rash    Red and swollen ears  . Codeine Nausea And Vomiting  . Iodinated Diagnostic Agents Nausea And Vomiting    Nausea and vomiting after IV contast dye approximately 35 years ago.  . Poison Ivy Extract Rash    Severe rash  . Tramadol Nausea And Vomiting    Migraine     Family History: Family History  Problem Relation Age of Onset  . Breast cancer Maternal Grandmother 46  . Arthritis Maternal Grandmother   . Asthma Maternal Grandmother   . Depression Maternal Grandmother   . Heart attack Maternal Grandmother   . Arthritis Mother   . Asthma Mother   . COPD Mother   . High Cholesterol Mother   . Hypertension Mother   . Stroke Mother   . Heart disease Father   . Hypertension Father   . Alcohol abuse Sister   . COPD Sister   . Alcohol abuse Brother   . Depression Brother   .  Drug abuse Brother     Social History:   reports that she has quit smoking. She has never used smokeless tobacco. She reports previous drug use. She reports that she does not drink alcohol.  Physical Exam: BP (!) 142/81   Pulse 67   Temp 98.1 F (36.7 C) (Oral)   Ht 5\' 3"  (1.6 m)   Wt 137 lb (62.1 kg)   LMP 03/17/2005 (Approximate)   BMI 24.27 kg/m   Constitutional:  Alert and oriented, rocking back and forth in chair, nontoxic appearing HEENT: [place], AT Cardiovascular: No clubbing, cyanosis, or edema Respiratory: Normal respiratory effort, no increased work of breathing Skin: No rashes, bruises or suspicious lesions Neurologic: Grossly intact, no focal deficits, moving all 4 extremities Psychiatric: Normal mood and affect  Laboratory Data: Results for orders  placed or performed in visit on 08/11/20  Microscopic Examination   Urine  Result Value Ref Range   WBC, UA 6-10 (A) 0 - 5 /hpf   RBC >30 (A) 0 - 2 /hpf   Epithelial Cells (non renal) 0-10 0 - 10 /hpf   Crystals Present (A) N/A   Crystal Type Calcium Oxalate N/A   Bacteria, UA Few None seen/Few  Urinalysis, Complete  Result Value Ref Range   Specific Gravity, UA >1.030 (H) 1.005 - 1.030   pH, UA 6.0 5.0 - 7.5   Color, UA Brown (A) Yellow   Appearance Ur Cloudy (A) Clear   Leukocytes,UA 1+ (A) Negative   Protein,UA 2+ (A) Negative/Trace   Glucose, UA Negative Negative   Ketones, UA Trace (A) Negative   RBC, UA 3+ (A) Negative   Bilirubin, UA Negative Negative   Urobilinogen, Ur 0.2 0.2 - 1.0 mg/dL   Nitrite, UA Positive (A) Negative   Microscopic Examination See below:    Pertinent Imaging: Results for orders placed during the hospital encounter of 08/10/20  Ultrasound renal complete  Narrative CLINICAL DATA:  Follow-up examination of right-sided hydronephrosis.  EXAM: RENAL / URINARY TRACT ULTRASOUND COMPLETE  COMPARISON:  Prior CT from 07/12/2020.  FINDINGS: Right Kidney:  Renal measurements: 10.7  x 5.4 x 4.8 cm = volume: 146.4 mL. Mildly increased echogenicity within the renal parenchyma. Persistent moderate right-sided hydronephrosis. No visible echogenic calculi. No focal renal mass.  Left Kidney:  Renal measurements: 11.5 x 4.9 x 5.3 cm = volume: 156.5 mL. Renal echogenicity within normal limits. No nephrolithiasis or hydronephrosis. No focal renal mass.  Bladder:  Appears normal for degree of bladder distention. Both jets are visualized at the bladder.  Other:  None.  IMPRESSION: 1. Persistent moderate right-sided hydronephrosis, similar as compared to prior CT from 07/12/2020. Both jets visualized at the bladder. 2. Mildly increased echogenicity within the right renal parenchyma, suggesting medical renal disease. 3. Normal left kidney.   Electronically Signed By: Rise Mu M.D. On: 08/11/2020 00:39  I personally reviewed the images referenced above and note persistent right hydronephrosis with bilateral ureteral jets.  Assessment & Plan:   1. Right ureteral stone Suspect persistent right ureteral stone x4 weeks given renewed right flank pain and stable right hydronephrosis on renal ultrasound.  She is afebrile, VSS in clinic today; no indication for urgent intervention at this time.  At this point, I recommend definitive stone management.  She is not a candidate for ESWL given radiolucent stone on KUB.  I offered her ureteroscopy with laser lithotripsy and stent placement with Dr. Lonna Cobb.  Patient is in agreement with this plan.    Starting her on Flomax and prescribing Percocet for pain control.  She already has Zofran at home for nausea and vomiting.  Will defer NSAIDs today given her history of bariatric surgery and concurrent Celebrex use.  We reviewed return precautions today including uncontrollable pain, uncontrollable nausea or vomiting, or new fever.  Counseled her to contact our clinic immediately or proceed to the emergency department if  she develops these outside of office hours.  She expressed understanding. - Urinalysis - tamsulosin (FLOMAX) 0.4 MG CAPS capsule; Take 1 capsule (0.4 mg total) by mouth daily.  Dispense: 30 capsule; Refill: 0 - oxyCODONE-acetaminophen (PERCOCET) 5-325 MG tablet; Take 1-2 tablets by mouth every 6 (six) hours as needed.  Dispense: 16 tablet; Refill: 0  2. Acute cystitis with hematuria UA today notable for nitrites with mild pyuria, minimal bacteria  on microscopy.  Suspect possible concurrent cystitis.  Will send urine for culture today for further evaluation.  Counseled her to continue Levaquin as previously prescribed.  We will recheck symptoms with her in 2 days and start a new antibiotic at that time if indicated. - CULTURE, URINE COMPREHENSIVE  Return for Surgical scheduler to call to arrange ureteroscopy.  Carman Ching, PA-C  [facility] [address] [place], [place] [address] [phone]

## 2020-08-13 ENCOUNTER — Ambulatory Visit: Payer: Self-pay | Admitting: Physician Assistant

## 2020-08-13 ENCOUNTER — Telehealth: Payer: Self-pay

## 2020-08-13 NOTE — Telephone Encounter (Signed)
-----   Message from Carman Ching, [place] sent at 08/13/2020  9:29 AM EST ----- Can you please call her and check how her pain is doing? If better/well controlled, ok to proceed with surgery on Tuesday. If poorly controlled, will add her on for ureteroscopy with Dr. Lonna Cobb tomorrow. ----- Message ----- From: Carman Ching, PA-C Sent: 08/11/2020   1:19 PM EST To: Carman Ching, PA-C  Recheck symptoms, move surgery up to Friday if pain is poorly controlled.  Check urine culture and start a new antibiotic if needed.

## 2020-08-13 NOTE — Telephone Encounter (Signed)
Patient reports she has been in some moderate pain however she believes waiting until Tuesday will be fine.

## 2020-08-14 ENCOUNTER — Other Ambulatory Visit: Payer: Self-pay

## 2020-08-14 ENCOUNTER — Encounter
Admission: RE | Admit: 2020-08-14 | Discharge: 2020-08-14 | Disposition: A | Discharge status: Home / Self Care | Payer: PPO | Source: Ambulatory Visit | Attending: Urology | Admitting: Urology

## 2020-08-14 HISTORY — DX: COVID-19: U07.1

## 2020-08-14 HISTORY — DX: Chronic obstructive pulmonary disease, unspecified: J44.9

## 2020-08-14 HISTORY — DX: Personal history of urinary calculi: Z87.442

## 2020-08-14 NOTE — Patient Instructions (Signed)
Your procedure is scheduled on: 08/18/20 Report to [place]. To find out your arrival time please call [phone] between 1PM - 3PM on 08/17/20.  Remember: Instructions that are not followed completely may result in serious medical risk, up to and including death, or upon the discretion of your surgeon and anesthesiologist your surgery may need to be rescheduled.     _X__ 1. Do not eat food after midnight the night before your procedure.                 No gum chewing or hard candies. You may drink clear liquids up to 2 hours                 before you are scheduled to arrive for your surgery- DO not drink clear                 liquids within 2 hours of the start of your surgery.                 Clear Liquids include:  water, apple juice without pulp, clear carbohydrate                 drink such as Clearfast or Gatorade, Black Coffee or Tea (Do not add                 anything to coffee or tea). Diabetics water only  __X__2.  On the morning of surgery brush your teeth with toothpaste and water, you                 may rinse your mouth with mouthwash if you wish.  Do not swallow any              toothpaste of mouthwash.     _X__ 3.  No Alcohol for 24 hours before or after surgery.   _X__ 4.  Do Not Smoke or use e-cigarettes For 24 Hours Prior to Your Surgery.                 Do not use any chewable tobacco products for at least 6 hours prior to                 surgery.  ____  5.  Bring all medications with you on the day of surgery if instructed.   __X__  6.  Notify your doctor if there is any change in your medical condition      (cold, fever, infections).     Do not wear jewelry, make-up, hairpins, clips or nail polish. Do not wear lotions, powders, or perfumes.  Do not shave 48 hours prior to surgery. Men may shave face and neck. Do not bring valuables to the hospital.    [facility] is not responsible for any belongings or  valuables.  Contacts, dentures/partials or body piercings may not be worn into surgery. Bring a case for your contacts, glasses or hearing aids, a denture cup will be supplied. Leave your suitcase in the car. After surgery it may be brought to your room. For patients admitted to the hospital, discharge time is determined by your treatment team.   Patients discharged the day of surgery will not be allowed to drive home.   Please read over the following fact sheets that you were given:   MRSA Information  __X__ Take these medicines the morning of surgery with A SIP OF WATER:  1. FLUoxetine (PROZAC) 20 MG capsule  2. omeprazole (PRILOSEC) 40 MG capsule  3. tamsulosin (FLOMAX) 0.4 MG CAPS capsule  4. MAY TAKE OXYCODONE IF NEEDED FROM PAIN  5.  6.  ____ Fleet Enema (as directed)   ____ Use CHG Soap/SAGE wipes as directed  __X__ Use inhalers on the day of surgery  ____ Stop metformin/Janumet/Farxiga 2 days prior to surgery    ____ Take 1/2 of usual insulin dose the night before surgery. No insulin the morning          of surgery.   ____ Stop Blood Thinners Coumadin/Plavix/Xarelto/Pleta/Pradaxa/Eliquis/Effient/Aspirin  on   Or contact your Surgeon, Cardiologist or Medical Doctor regarding  ability to stop your blood thinners  __X__ Stop Anti-inflammatories 7 days before surgery such as Advil, Ibuprofen, Motrin,  BC or Goodies Powder, Naprosyn, Naproxen, Aleve, Aspirin, Ketorolac    __X__ Stop all herbal supplements, fish oil or vitamin E until after surgery.    ____ Bring C-Pap to the hospital.

## 2020-08-15 LAB — CULTURE, URINE COMPREHENSIVE

## 2020-08-17 ENCOUNTER — Telehealth: Payer: Self-pay | Admitting: Radiology

## 2020-08-17 ENCOUNTER — Encounter
Admission: RE | Admit: 2020-08-17 | Discharge: 2020-08-17 | Disposition: A | Discharge status: Home / Self Care | Payer: PPO | Source: Ambulatory Visit | Attending: Urology | Admitting: Urology

## 2020-08-17 ENCOUNTER — Other Ambulatory Visit: Admission: RE | Admit: 2020-08-17 | Payer: PPO | Source: Ambulatory Visit

## 2020-08-17 ENCOUNTER — Other Ambulatory Visit: Payer: Self-pay

## 2020-08-17 DIAGNOSIS — Z01810 Encounter for preprocedural cardiovascular examination: Secondary | ICD-10-CM | POA: Insufficient documentation

## 2020-08-17 DIAGNOSIS — N201 Calculus of ureter: Secondary | ICD-10-CM

## 2020-08-17 DIAGNOSIS — Z01818 Encounter for other preprocedural examination: Secondary | ICD-10-CM | POA: Diagnosis not present

## 2020-08-17 MED ORDER — OXYCODONE-ACETAMINOPHEN 5-325 MG PO TABS
0.5000 | ORAL_TABLET | Freq: Four times a day (QID) | ORAL | 0 refills | Status: DC | PRN
Start: 2020-08-17 — End: 2020-08-20

## 2020-08-17 NOTE — Telephone Encounter (Signed)
Patient requests refill of percocet. Reports pain in lower abdomen and right kidney.

## 2020-08-17 NOTE — Telephone Encounter (Signed)
Notified patient of script sent to pharmacy. 

## 2020-08-18 ENCOUNTER — Encounter
Admission: RE | Disposition: A | Discharge status: Home / Self Care | Payer: Self-pay | Source: Home / Self Care | Attending: Urology

## 2020-08-18 ENCOUNTER — Telehealth: Payer: Self-pay | Admitting: Urology

## 2020-08-18 ENCOUNTER — Ambulatory Visit
Admission: RE | Admit: 2020-08-18 | Discharge: 2020-08-18 | Disposition: A | Discharge status: Home / Self Care | Payer: PPO | Attending: Urology | Admitting: Urology

## 2020-08-18 ENCOUNTER — Other Ambulatory Visit: Payer: Self-pay

## 2020-08-18 ENCOUNTER — Ambulatory Visit: Payer: PPO | Admitting: Anesthesiology

## 2020-08-18 ENCOUNTER — Encounter: Payer: Self-pay | Admitting: Urology

## 2020-08-18 ENCOUNTER — Ambulatory Visit: Payer: PPO

## 2020-08-18 DIAGNOSIS — N201 Calculus of ureter: Secondary | ICD-10-CM | POA: Diagnosis not present

## 2020-08-18 DIAGNOSIS — Z79899 Other long term (current) drug therapy: Secondary | ICD-10-CM | POA: Insufficient documentation

## 2020-08-18 DIAGNOSIS — Z882 Allergy status to sulfonamides status: Secondary | ICD-10-CM | POA: Diagnosis not present

## 2020-08-18 DIAGNOSIS — Z87442 Personal history of urinary calculi: Secondary | ICD-10-CM | POA: Diagnosis not present

## 2020-08-18 DIAGNOSIS — N23 Unspecified renal colic: Secondary | ICD-10-CM | POA: Insufficient documentation

## 2020-08-18 DIAGNOSIS — Z885 Allergy status to narcotic agent status: Secondary | ICD-10-CM | POA: Diagnosis not present

## 2020-08-18 DIAGNOSIS — N132 Hydronephrosis with renal and ureteral calculous obstruction: Secondary | ICD-10-CM | POA: Insufficient documentation

## 2020-08-18 DIAGNOSIS — Z888 Allergy status to other drugs, medicaments and biological substances status: Secondary | ICD-10-CM | POA: Insufficient documentation

## 2020-08-18 HISTORY — PX: CYSTOSCOPY/URETEROSCOPY/HOLMIUM LASER/STENT PLACEMENT: SHX6546

## 2020-08-18 SURGERY — CYSTOSCOPY/URETEROSCOPY/HOLMIUM LASER/STENT PLACEMENT
Anesthesia: General | Laterality: Right

## 2020-08-18 MED ORDER — LIDOCAINE HCL (CARDIAC) PF 100 MG/5ML IV SOSY
PREFILLED_SYRINGE | INTRAVENOUS | Status: DC | PRN
  Administered 2020-08-18: 80 mg via INTRAVENOUS

## 2020-08-18 MED ORDER — DEXAMETHASONE SODIUM PHOSPHATE 10 MG/ML IJ SOLN
INTRAMUSCULAR | Status: DC | PRN
  Administered 2020-08-18: 10 mg via INTRAVENOUS

## 2020-08-18 MED ORDER — MIDAZOLAM HCL 2 MG/2ML IJ SOLN
INTRAMUSCULAR | Status: AC
  Filled 2020-08-18: qty 2

## 2020-08-18 MED ORDER — CHLORHEXIDINE GLUCONATE 0.12 % MT SOLN
15.0000 mL | Freq: Once | OROMUCOSAL | Status: AC
  Administered 2020-08-18: 15 mL via OROMUCOSAL

## 2020-08-18 MED ORDER — CEFAZOLIN SODIUM-DEXTROSE 1-4 GM/50ML-% IV SOLN
INTRAVENOUS | Status: AC
  Filled 2020-08-18: qty 50

## 2020-08-18 MED ORDER — OXYCODONE-ACETAMINOPHEN 5-325 MG PO TABS
ORAL_TABLET | ORAL | Status: AC
  Filled 2020-08-18: qty 1

## 2020-08-18 MED ORDER — FENTANYL CITRATE (PF) 100 MCG/2ML IJ SOLN: 25.0000 ug | INTRAMUSCULAR | Status: DC | PRN

## 2020-08-18 MED ORDER — FENTANYL CITRATE (PF) 100 MCG/2ML IJ SOLN
INTRAMUSCULAR | Status: AC
  Filled 2020-08-18: qty 2

## 2020-08-18 MED ORDER — PROPOFOL 10 MG/ML IV BOLUS
INTRAVENOUS | Status: DC | PRN
  Administered 2020-08-18: 100 mg via INTRAVENOUS

## 2020-08-18 MED ORDER — ONDANSETRON HCL 4 MG/2ML IJ SOLN
INTRAMUSCULAR | Status: DC | PRN
  Administered 2020-08-18: 4 mg via INTRAVENOUS

## 2020-08-18 MED ORDER — FENTANYL CITRATE (PF) 100 MCG/2ML IJ SOLN
INTRAMUSCULAR | Status: DC | PRN
  Administered 2020-08-18: 25 ug via INTRAVENOUS
  Administered 2020-08-18: 50 ug via INTRAVENOUS
  Administered 2020-08-18: 25 ug via INTRAVENOUS

## 2020-08-18 MED ORDER — PROPOFOL 10 MG/ML IV BOLUS
INTRAVENOUS | Status: AC
  Filled 2020-08-18: qty 20

## 2020-08-18 MED ORDER — ONDANSETRON HCL 4 MG/2ML IJ SOLN: 4.0000 mg | Freq: Once | INTRAMUSCULAR | Status: DC | PRN

## 2020-08-18 MED ORDER — OXYBUTYNIN CHLORIDE 5 MG PO TABS: ORAL_TABLET | ORAL | 0 refills | Status: DC

## 2020-08-18 MED ORDER — CEFAZOLIN SODIUM-DEXTROSE 1-4 GM/50ML-% IV SOLN
1.0000 g | INTRAVENOUS | Status: AC
  Administered 2020-08-18: 1 g via INTRAVENOUS

## 2020-08-18 MED ORDER — MIDAZOLAM HCL 2 MG/2ML IJ SOLN
INTRAMUSCULAR | Status: DC | PRN
  Administered 2020-08-18: 1 mg via INTRAVENOUS

## 2020-08-18 MED ORDER — LACTATED RINGERS IV SOLN: INTRAVENOUS | Status: DC

## 2020-08-18 MED ORDER — ORAL CARE MOUTH RINSE: 15.0000 mL | Freq: Once | OROMUCOSAL | Status: AC

## 2020-08-18 MED ORDER — OXYCODONE-ACETAMINOPHEN 5-325 MG PO TABS
1.0000 | ORAL_TABLET | Freq: Once | ORAL | Status: AC
  Administered 2020-08-18: 1 via ORAL

## 2020-08-18 MED ORDER — ACETAMINOPHEN 10 MG/ML IV SOLN: 1000.0000 mg | Freq: Once | INTRAVENOUS | Status: DC | PRN

## 2020-08-18 MED ORDER — PHENYLEPHRINE HCL (PRESSORS) 10 MG/ML IV SOLN
INTRAVENOUS | Status: DC | PRN
  Administered 2020-08-18 (×5): 100 ug via INTRAVENOUS

## 2020-08-18 MED ORDER — CHLORHEXIDINE GLUCONATE 0.12 % MT SOLN
OROMUCOSAL | Status: AC
  Filled 2020-08-18: qty 15

## 2020-08-18 SURGICAL SUPPLY — 34 items
BAG DRAIN CYSTO-URO LG1000N (MISCELLANEOUS) ×2 IMPLANT
BASKET ZERO TIP 1.9FR (BASKET) ×1 IMPLANT
BRUSH SCRUB EZ 1% IODOPHOR (MISCELLANEOUS) ×2 IMPLANT
BSKT STON RTRVL ZERO TP 1.9FR (BASKET) ×1
CATH URETL 5X70 OPEN END (CATHETERS) ×1 IMPLANT
CNTNR SPEC 2.5X3XGRAD LEK (MISCELLANEOUS)
CONT SPEC 4OZ STER OR WHT (MISCELLANEOUS)
CONT SPEC 4OZ STRL OR WHT (MISCELLANEOUS)
CONTAINER SPEC 2.5X3XGRAD LEK (MISCELLANEOUS) IMPLANT
DRAPE UTILITY 15X26 TOWEL STRL (DRAPES) ×2 IMPLANT
GLOVE BIOGEL PI IND STRL 7.5 (GLOVE) ×1 IMPLANT
GLOVE BIOGEL PI INDICATOR 7.5 (GLOVE) ×1
GOWN STRL REUS W/ TWL LRG LVL3 (GOWN DISPOSABLE) ×1 IMPLANT
GOWN STRL REUS W/ TWL XL LVL3 (GOWN DISPOSABLE) ×1 IMPLANT
GOWN STRL REUS W/TWL LRG LVL3 (GOWN DISPOSABLE) ×2
GOWN STRL REUS W/TWL XL LVL3 (GOWN DISPOSABLE) ×2
GUIDEWIRE STR DUAL SENSOR (WIRE) ×3 IMPLANT
INFUSOR MANOMETER BAG 3000ML (MISCELLANEOUS) ×2 IMPLANT
INTRODUCER DILATOR DOUBLE (INTRODUCER) IMPLANT
KIT TURNOVER CYSTO (KITS) ×2 IMPLANT
MANIFOLD NEPTUNE II (INSTRUMENTS) ×2 IMPLANT
PACK CYSTO AR (MISCELLANEOUS) ×2 IMPLANT
SET CYSTO W/LG BORE CLAMP LF (SET/KITS/TRAYS/PACK) ×2 IMPLANT
SHEATH URETERAL 12FRX35CM (MISCELLANEOUS) IMPLANT
SOL .9 NS 3000ML IRR  AL (IV SOLUTION) ×2
SOL .9 NS 3000ML IRR AL (IV SOLUTION) ×1
SOL .9 NS 3000ML IRR UROMATIC (IV SOLUTION) ×1 IMPLANT
STENT URET 6FRX22 CONTOUR (STENTS) ×1 IMPLANT
STENT URET 6FRX24 CONTOUR (STENTS) IMPLANT
STENT URET 6FRX26 CONTOUR (STENTS) IMPLANT
SURGILUBE 2OZ TUBE FLIPTOP (MISCELLANEOUS) ×2 IMPLANT
TRACTIP FLEXIVA PULSE ID 200 (Laser) ×2 IMPLANT
VALVE UROSEAL ADJ ENDO (VALVE) IMPLANT
WATER STERILE IRR 1000ML POUR (IV SOLUTION) ×2 IMPLANT

## 2020-08-18 NOTE — Op Note (Signed)
Preoperative diagnosis:  1. Right ureteral calculus 2. Renal colic secondary to above  Postoperative diagnosis:  1.  Same  Procedure:  1. Cystoscopy 2. Right ureteroscopy and stone removal 3. Ureteroscopic laser lithotripsy 4. Right ureteral stent placement (6FR/22 cm) 5. Right retrograde pyelography with interpretation  Surgeon: Lorin Picket C. Stoioff, M.D.  Anesthesia: General  Complications: None  Intraoperative findings:   1. Cystoscopy-bladder mucosa normal in appearance without erythema, solid or papillary lesions.  Ureteral orifices normal-appearing bilaterally 2. Ureteroscopy-calculus identified right mid ureter with marked inflammatory change ureteral mucosal  EBL: Minimal  Specimens: 1. Calculus fragments for analysis   Indication: Valerie Ford is a 65 y.o. initially presented to ED early January 2022 with renal colic secondary to 4 mm right proximal ureteral calculus.  She initially elected a trial of passage however recently has had persistent pain.  Follow-up renal ultrasound showed persistent hydronephrosis and she elected ureteroscopic removal.  After reviewing the management options for treatment, the patient elected to proceed with the above surgical procedure(s). We have discussed the potential benefits and risks of the procedure, side effects of the proposed treatment, the likelihood of the patient achieving the goals of the procedure, and any potential problems that might occur during the procedure or recuperation. Informed consent has been obtained.  Description of procedure:  The patient was taken to the operating room and general anesthesia was induced.  The patient was placed in the dorsal lithotomy position, prepped and draped in the usual sterile fashion, and preoperative antibiotics were administered. A preoperative time-out was performed.   A 21 French cystoscope was lubricated and passed per urethra.  Panendoscopy was performed with findings as described  above.  Attention was directed to the right ureteral orifice and a 0.038 Sensor wire was then advanced up the ureter into the renal pelvis under fluoroscopic guidance.  A 4.5 Fr semirigid ureteroscope was then advanced into the ureter next to the guidewire.  The distal ureter was normal in appearance.  There was tortuosity in the right mid ureter necessitating placement of a second guidewire through the ureteroscope and through this area and the stone was identified just proximal to this area of tortuosity.  The stone was partially fragmented with a 200 micron holmium laser fiber on a setting of 0.2 J and frequency of 20 hz.   The largest fragment was then removed from the ureter with a zero tip nitinol basket.  Two additional fragments were removed with a basket.  Reinspection of the ureter revealed no remaining visible stones or fragments.   Retrograde pyelogram was performed with findings as described above.  A 6 FR/22 CM Contour ureteral stent was placed under fluoroscopic guidance.  The wire was then removed with an adequate stent curl noted in the renal pelvis as well as in the bladder.  The bladder was then emptied and the procedure ended.  The patient appeared to tolerate the procedure well and without complications.  After anesthetic reversal the patient was transported to the PACU in stable condition.  Plan:  Office follow-up will be scheduled in approximately 1 week for stent removal   Irineo Axon, MD

## 2020-08-18 NOTE — Interval H&P Note (Signed)
History and Physical Interval Note: Continues to have intermittent right flank/abdominal pain.  Follow-up ultrasound with persistent right hydronephrosis.  She has elected ureteroscopic stone removal.  The procedure was discussed in detail. CV: RRR Lungs: Clear  08/18/2020 7:30 AM  Valerie Ford  has presented today for surgery, with the diagnosis of right ureteral calculus.  The various methods of treatment have been discussed with the patient and family. After consideration of risks, benefits and other options for treatment, the patient has consented to  Procedure(s): CYSTOSCOPY/URETEROSCOPY/HOLMIUM LASER/STONE REMOVAL/STENT PLACEMENT (Right) as a surgical intervention.  The patient's history has been reviewed, patient examined, no change in status, stable for surgery.  I have reviewed the patient's chart and labs.  Questions were answered to the patient's satisfaction.     Scott C Stoioff

## 2020-08-18 NOTE — Anesthesia Postprocedure Evaluation (Signed)
Anesthesia Post Note  Patient: Valerie Ford  Procedure(s) Performed: CYSTOSCOPY/URETEROSCOPY/HOLMIUM LASER/STONE REMOVAL/STENT PLACEMENT (Right )  Patient location during evaluation: PACU Anesthesia Type: General Level of consciousness: awake and alert Pain management: pain level controlled Vital Signs Assessment: post-procedure vital signs reviewed and stable Respiratory status: spontaneous breathing, nonlabored ventilation, respiratory function stable and patient connected to nasal cannula oxygen Cardiovascular status: blood pressure returned to baseline and stable Postop Assessment: no apparent nausea or vomiting Anesthetic complications: no   No complications documented.   Last Vitals:  Vitals:   08/18/20 0900 08/18/20 0910  BP: 122/75 123/73  Pulse: 67 68  Resp: 12 (!) 21  Temp: (!) 36.3 C   SpO2: 95% 94%    Last Pain:  Vitals:   08/18/20 0900  PainSc: 0-No pain                 Corinda Gubler

## 2020-08-18 NOTE — Anesthesia Procedure Notes (Signed)
Procedure Name: LMA Insertion Date/Time: 08/18/2020 8:12 AM Performed by: Junious Silk, CRNA Pre-anesthesia Checklist: Patient identified, Patient being monitored, Timeout performed, Emergency Drugs available and Suction available Patient Re-evaluated:Patient Re-evaluated prior to induction Oxygen Delivery Method: Circle system utilized Preoxygenation: Pre-oxygenation with 100% oxygen Induction Type: IV induction Ventilation: Mask ventilation without difficulty LMA: LMA inserted LMA Size: 4.0 Tube type: Oral Number of attempts: 1 Placement Confirmation: positive ETCO2 and breath sounds checked- equal and bilateral Tube secured with: Tape Dental Injury: Teeth and Oropharynx as per pre-operative assessment

## 2020-08-18 NOTE — Transfer of Care (Signed)
Immediate Anesthesia Transfer of Care Note  Patient: Valerie Ford  Procedure(s) Performed: CYSTOSCOPY/URETEROSCOPY/HOLMIUM LASER/STONE REMOVAL/STENT PLACEMENT (Right )  Patient Location: PACU  Anesthesia Type:General  Level of Consciousness: sedated  Airway & Oxygen Therapy: Patient Spontanous Breathing and Patient connected to face mask oxygen  Post-op Assessment: Report given to RN and Post -op Vital signs reviewed and stable  Post vital signs: Reviewed and stable  Last Vitals:  Vitals Value Taken Time  BP 105/58 08/18/20 0831  Temp    Pulse 62 08/18/20 0832  Resp 8 08/18/20 0832  SpO2 98 % 08/18/20 0832  Vitals shown include unvalidated device data.  Last Pain:  Vitals:   08/18/20 0619  PainSc: 6       Patients Stated Pain Goal: 0 (08/18/20 8299)  Complications: No complications documented.

## 2020-08-18 NOTE — Telephone Encounter (Signed)
App made patient is aware 

## 2020-08-18 NOTE — Telephone Encounter (Signed)
-----   Message from Riki Altes, MD sent at 08/18/2020  8:55 AM EST ----- Regarding: Cystoscopy with stent removal Please schedule cystoscopy with stent removal sometime next week

## 2020-08-18 NOTE — Anesthesia Preprocedure Evaluation (Signed)
Anesthesia Evaluation  Patient identified by MRN, date of birth, ID band Patient awake    Reviewed: Allergy & Precautions, NPO status , Patient's Chart, lab work & pertinent test results  History of Anesthesia Complications Negative for: history of anesthetic complications  Airway Mallampati: III  TM Distance: >3 FB Neck ROM: Full    Dental no notable dental hx. 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   breath sounds clear to auscultation- rhonchi (-) wheezing      Cardiovascular Exercise Tolerance: Good (-) hypertension(-) CAD, (-) Past MI, (-) Cardiac Stents and (-) CABG  Rhythm:Regular Rate:Normal - Systolic murmurs and - Diastolic murmurs    Neuro/Psych  Headaches, PSYCHIATRIC DISORDERS (ADHD)    GI/Hepatic Neg liver ROS, GERD  Medicated and Controlled,  Endo/Other  negative endocrine ROSneg diabetes  Renal/GU negative Renal ROS     Musculoskeletal  (+) Arthritis ,   Abdominal (+) - obese,   Peds  Hematology negative hematology ROS (+)   Anesthesia Other Findings Past Medical History: 01/08/2018: ADHD 01/07/2018: Fatty liver 01/07/2018: GERD (gastroesophageal reflux disease) 01/07/2018: History of bariatric surgery 01/11/2018: History of cocaine use, remote, > 20 years 01/11/2018: Migraine with aura and without status migrainosus, not  intractable 10/20/2016: Obstructive sleep apnea syndrome 01/07/2018: Osteoarthritis of right knee 01/07/2018: Sacroiliitis (HCC) 01/11/2018: Seasonal allergic rhinitis due to pollen 01/11/2018: Vitamin D deficiency   Reproductive/Obstetrics                             Anesthesia Physical  Anesthesia Plan  ASA: II  Anesthesia Plan: General   Post-op Pain  Management:    Induction: Intravenous  PONV Risk Score and Plan: 4 or greater and Ondansetron and Dexamethasone  Airway Management Planned: LMA  Additional Equipment: None  Intra-op Plan:   Post-operative Plan: Extubation in OR  Informed Consent: I have reviewed the patients History and Physical, chart, labs and discussed the procedure including the risks, benefits and alternatives for the proposed anesthesia with the patient or authorized representative who has indicated his/her understanding and acceptance.     Dental advisory given  Plan Discussed with: CRNA and Anesthesiologist  Anesthesia Plan Comments: (Discussed risks of anesthesia with patient, including PONV, sore throat, lip/dental damage. Rare risks discussed as well, such as cardiorespiratory and neurological sequelae. Patient understands.)        Anesthesia Quick Evaluation

## 2020-08-18 NOTE — Discharge Instructions (Signed)
DISCHARGE INSTRUCTIONS FOR KIDNEY STONE/URETERAL STENT   MEDICATIONS:  1. Resume all your other meds from home.  2.  AZO (over-the-counter) can help with the burning/stinging when you urinate. 3.  Oxybutynin is for stent irritation, Rx was sent to your pharmacy.  ACTIVITY:  1. May resume regular activities in 24 hours. 2. No driving while on narcotic pain medications  3. Drink plenty of water  4. Continue to walk at home - you can still get blood clots when you are at home, so keep active, but don't over do it.  5. May return to work/school tomorrow or when you feel ready    SIGNS/SYMPTOMS TO CALL:  Please call us if you have a fever greater than 101.5, uncontrolled nausea/vomiting, uncontrolled pain, dizziness, unable to urinate, bloody urine, chest pain, shortness of breath, leg swelling, leg pain, or any other concerns or questions.   You can reach [place] at [phone].   FOLLOW-UP:  1.  You had significant inflammation of the ureter secondary to the stone and recommend leaving the stent in place for approximately 1 week.  You will be contacted by our office for a stent removal appointment sometime next week.  AMBULATORY SURGERY  DISCHARGE INSTRUCTIONS   1) The drugs that you were given will stay in your system until tomorrow so for the next 24 hours you should not:  A) Drive an automobile B) Make any legal decisions C) Drink any alcoholic beverage   2) You may resume regular meals tomorrow.  Today it is better to start with liquids and gradually work up to solid foods.  You may eat anything you prefer, but it is better to start with liquids, then soup and crackers, and gradually work up to solid foods.   3) Please notify your doctor immediately if you have any unusual bleeding, trouble breathing, redness and pain at the surgery site, drainage, fever, or pain not relieved by medication.    4) Additional Instructions:        Please contact your physician with any  problems or Same Day Surgery at [phone], Monday through Friday 6 am to 4 pm, or [place] at [facility] number at [phone].

## 2020-08-19 ENCOUNTER — Encounter: Payer: Self-pay | Admitting: Urology

## 2020-08-20 ENCOUNTER — Telehealth: Payer: Self-pay | Admitting: Radiology

## 2020-08-20 DIAGNOSIS — N201 Calculus of ureter: Secondary | ICD-10-CM

## 2020-08-20 MED ORDER — OXYCODONE-ACETAMINOPHEN 5-325 MG PO TABS: 0.5000 | ORAL_TABLET | Freq: Four times a day (QID) | ORAL | 0 refills | Status: DC | PRN

## 2020-08-20 NOTE — Telephone Encounter (Signed)
Percocet refilled.  Will have patient come in earlier next week for stent removal.

## 2020-08-20 NOTE — Telephone Encounter (Signed)
Called pt she states that she is having significant pain in RT flank pain and gross hematuria. Advised pt that hematuria w/ stent in place is normal, encouraged hydration. She states that she is taking oxybutynin and Flomax as prescribed in addition she is taking extra strength tylenol with no relief. She is unable to take NSAIDS. She has 1 Percocet tablet left. Please advise.

## 2020-08-20 NOTE — Telephone Encounter (Signed)
Patient reports continued bleeding and pain in lower right abdomen and right flank after surgery with Dr Lonna Cobb on 08/18/2020. Please return call to [phone].

## 2020-08-21 NOTE — Telephone Encounter (Signed)
How soon do you want pt to come in?

## 2020-08-22 LAB — CALCULI, WITH PHOTOGRAPH (CLINICAL LAB)
Calcium Oxalate Dihydrate: 90 %
Calcium Oxalate Monohydrate: 10 %
Weight Calculi: 6 mg

## 2020-08-25 ENCOUNTER — Other Ambulatory Visit: Payer: Self-pay

## 2020-08-25 ENCOUNTER — Encounter: Payer: Self-pay | Admitting: Urology

## 2020-08-25 ENCOUNTER — Ambulatory Visit (INDEPENDENT_AMBULATORY_CARE_PROVIDER_SITE_OTHER): Payer: PPO | Admitting: Urology

## 2020-08-25 VITALS — BP 132/75 | HR 75 | Temp 98.0°F | Ht 63.0 in | Wt 140.0 lb

## 2020-08-25 DIAGNOSIS — N201 Calculus of ureter: Secondary | ICD-10-CM

## 2020-08-25 LAB — URINALYSIS, COMPLETE
Bilirubin, UA: NEGATIVE
Glucose, UA: NEGATIVE
Nitrite, UA: POSITIVE — AB
Specific Gravity, UA: 1.025 (ref 1.005–1.030)
Urobilinogen, Ur: 1 mg/dL (ref 0.2–1.0)
pH, UA: 6 (ref 5.0–7.5)

## 2020-08-25 LAB — MICROSCOPIC EXAMINATION
Cast Type: NONE SEEN
Casts: NONE SEEN /lpf
RBC, Urine: >30 /hpf — AB (ref 0–2)
Renal Epithel, UA: NONE SEEN /hpf
Trichomonas, UA: NONE SEEN
Yeast, UA: NONE SEEN

## 2020-08-25 MED ORDER — LEVOFLOXACIN 500 MG PO TABS
500.0000 mg | ORAL_TABLET | Freq: Once | ORAL | Status: AC
  Administered 2020-08-25: 500 mg via ORAL

## 2020-08-25 NOTE — Progress Notes (Signed)
Indications: Patient is 65 y.o., who is s/p ureteroscopic removal of a 4 mm right mid ureteral calculus 08/18/2020.  She has had significant stent symptoms and the patient is presenting today for stent removal.  Stone analysis: CaOxMono/CaOxDi%: 10/90  Procedure:  Flexible Cystoscopy with stent removal (66063)  Timeout was performed and the correct patient, procedure and participants were identified.    Description:  The patient was prepped and draped in the usual sterile fashion. Flexible cystosopy was performed.  The stent was visualized, grasped, and removed intact without difficulty. The patient tolerated the procedure well.  A single dose of oral antibiotics was given.  Complications:  None   Plan: She has a 1 month PA follow-up already scheduled    Irineo Axon, MD

## 2020-08-26 DIAGNOSIS — R519 Headache, unspecified: Secondary | ICD-10-CM | POA: Diagnosis not present

## 2020-08-26 DIAGNOSIS — J301 Allergic rhinitis due to pollen: Secondary | ICD-10-CM | POA: Diagnosis not present

## 2020-08-26 DIAGNOSIS — J328 Other chronic sinusitis: Secondary | ICD-10-CM | POA: Diagnosis not present

## 2020-08-27 ENCOUNTER — Encounter: Payer: PPO | Admitting: Urology

## 2020-09-09 DIAGNOSIS — R1310 Dysphagia, unspecified: Secondary | ICD-10-CM | POA: Diagnosis not present

## 2020-09-14 ENCOUNTER — Ambulatory Visit
Admission: RE | Admit: 2020-09-14 | Discharge: 2020-09-14 | Disposition: A | Discharge status: Home / Self Care | Payer: PPO | Source: Ambulatory Visit | Attending: Physician Assistant | Admitting: Physician Assistant

## 2020-09-14 ENCOUNTER — Other Ambulatory Visit: Payer: Self-pay

## 2020-09-14 DIAGNOSIS — N201 Calculus of ureter: Secondary | ICD-10-CM | POA: Diagnosis not present

## 2020-09-14 DIAGNOSIS — N1330 Unspecified hydronephrosis: Secondary | ICD-10-CM | POA: Diagnosis not present

## 2020-09-15 ENCOUNTER — Ambulatory Visit: Payer: PPO | Admitting: Physician Assistant

## 2020-09-15 ENCOUNTER — Other Ambulatory Visit: Payer: Self-pay | Admitting: Physician Assistant

## 2020-09-15 DIAGNOSIS — N201 Calculus of ureter: Secondary | ICD-10-CM

## 2020-09-21 ENCOUNTER — Ambulatory Visit: Payer: PPO | Admitting: Physician Assistant

## 2020-09-21 NOTE — Progress Notes (Deleted)
09/22/2020 2:29 PM   Valerie Ford Apr 14, 1956 [id number]  Referring provider: [name], MD [address] [facility] [place],  [place] [address]  No chief complaint on file.  Urological history: 1. Nephrolithiasis - most recent episode 08/2020 for 4 mm right mid ureteral stone - stone composition 90% calcium oxalate dihydrate and 10% calcium oxalate monohydrate   2. Microscopic hematuria - likely due the stone   HPI: ***   Reviewed referral notes.    PMH: Past Medical History:  Diagnosis Date  . ADHD 01/08/2018  . COPD (chronic obstructive pulmonary disease) (HCC)   . COVID-19   . Fatty liver 01/07/2018  . GERD (gastroesophageal reflux disease) 01/07/2018  . History of bariatric surgery 01/07/2018  . History of cocaine use, remote, > 20 years 01/11/2018  . History of kidney stones   . Migraine with aura and without status migrainosus, not intractable 01/11/2018  . Obstructive sleep apnea syndrome 10/20/2016  . Osteoarthritis of right knee 01/07/2018  . Sacroiliitis (HCC) 01/07/2018  . Seasonal allergic rhinitis due to pollen 01/11/2018  . Vitamin D deficiency 01/11/2018    Surgical History: Past Surgical History:  Procedure Laterality Date  . CESAREAN SECTION    . CYSTOSCOPY/URETEROSCOPY/HOLMIUM LASER/STENT PLACEMENT Left 05/15/2018   Procedure: CYSTOSCOPY/URETEROSCOPY/HOLMIUM LASER/STONE REMOVAL/STENT PLACEMENT;  Surgeon: Riki Altes, MD;  Location: [facility];  Service: Urology;  Laterality: Left;  . CYSTOSCOPY/URETEROSCOPY/HOLMIUM LASER/STENT PLACEMENT Right 08/18/2020   Procedure: CYSTOSCOPY/URETEROSCOPY/HOLMIUM LASER/STONE REMOVAL/STENT PLACEMENT;  Surgeon: Riki Altes, MD;  Location: [facility];  Service: Urology;  Laterality: Right;  . GASTRIC ROUX-EN-Y    . NASAL SINUS SURGERY      Home Medications:  Allergies as of 09/22/2020      Reactions   Gadolinium Nausea And Vomiting   Sulfa Antibiotics Hives, Rash   Red and swollen ears    Codeine Nausea And Vomiting   Iodinated Diagnostic Agents Nausea And Vomiting   Nausea and vomiting after IV contast dye approximately 35 years ago.   Poison Ivy Extract Rash   Severe rash   Tramadol Nausea And Vomiting   Migraine      Medication List       Accurate as of September 21, 2020  2:29 PM. If you have any questions, ask your nurse or doctor.        acetaminophen 500 MG tablet Commonly known as: TYLENOL Take 1,000 mg by mouth every 6 (six) hours as needed for moderate pain or headache.   albuterol 108 (90 Base) MCG/ACT inhaler Commonly known as: VENTOLIN HFA Inhale 2 puffs into the lungs every 6 (six) hours as needed for wheezing or shortness of breath.   amphetamine-dextroamphetamine 10 MG tablet Commonly known as: ADDERALL Take 10 mg by mouth 3 (three) times daily.   baclofen 10 MG tablet Commonly known as: LIORESAL Take 10 mg by mouth at bedtime.   celecoxib 200 MG capsule Commonly known as: CELEBREX Take 200 mg by mouth daily.   cholecalciferol 25 MCG (1000 UNIT) tablet Commonly known as: VITAMIN D Take 1,000 Units by mouth daily.   COLLAGEN PO Take 1 Scoop by mouth daily.   FLUoxetine 20 MG capsule Commonly known as: PROZAC Take 20 mg by mouth daily.   fluticasone 50 MCG/ACT nasal spray Commonly known as: FLONASE Place 1 spray into both nostrils daily as needed for allergies or rhinitis.   furosemide 20 MG tablet Commonly known as: LASIX Take 20 mg by mouth daily as needed (swelling).  ketorolac 10 MG tablet Commonly known as: TORADOL Take 10 mg by mouth daily as needed for moderate pain.   multivitamin with minerals Tabs tablet Take 1 tablet by mouth daily.   omeprazole 40 MG capsule Commonly known as: PRILOSEC Take 40 mg by mouth daily.   ondansetron 4 MG disintegrating tablet Commonly known as: Zofran ODT Take 1 tablet (4 mg total) by mouth every 8 (eight) hours as needed. What changed: reasons to take this   oxybutynin 5 MG  tablet Commonly known as: DITROPAN 1 tab tid prn frequency,urgency, bladder spasm   oxyCODONE-acetaminophen 5-325 MG tablet Commonly known as: Percocet Take 0.5-1 tablets by mouth every 6 (six) hours as needed for severe pain.   potassium chloride 10 MEQ tablet Commonly known as: KLOR-CON Take 10 mEq by mouth daily as needed (when taking furosemide.).   Spiriva HandiHaler 18 MCG inhalation capsule Generic drug: tiotropium Place 1 capsule into inhaler and inhale daily.   tamsulosin 0.4 MG Caps capsule Commonly known as: FLOMAX Take 1 capsule (0.4 mg total) by mouth daily.   traZODone 100 MG tablet Commonly known as: DESYREL Take 100 mg by mouth at bedtime.   Ubrelvy 50 MG Tabs Generic drug: Ubrogepant Take 50 mg by mouth daily as needed (migraine).   verapamil 80 MG tablet Commonly known as: CALAN Take 80 mg by mouth 2 (two) times daily.       Allergies:  Allergies  Allergen Reactions  . Gadolinium Nausea And Vomiting  . Sulfa Antibiotics Hives and Rash    Red and swollen ears  . Codeine Nausea And Vomiting  . Iodinated Diagnostic Agents Nausea And Vomiting    Nausea and vomiting after IV contast dye approximately 35 years ago.  . Poison Ivy Extract Rash    Severe rash  . Tramadol Nausea And Vomiting    Migraine     Family History: Family History  Problem Relation Age of Onset  . Breast cancer Maternal Grandmother 27  . Arthritis Maternal Grandmother   . Asthma Maternal Grandmother   . Depression Maternal Grandmother   . Heart attack Maternal Grandmother   . Arthritis Mother   . Asthma Mother   . COPD Mother   . High Cholesterol Mother   . Hypertension Mother   . Stroke Mother   . Heart disease Father   . Hypertension Father   . Alcohol abuse Sister   . COPD Sister   . Alcohol abuse Brother   . Depression Brother   . Drug abuse Brother     Social History:  reports that she has quit smoking. She has never used smokeless tobacco. She reports  previous drug use. She reports that she does not drink alcohol.  ROS: Pertinent ROS in HPI  Physical Exam: LMP 03/17/2005 (Approximate)   Constitutional:  Well nourished. Alert and oriented, No acute distress. HEENT: [place] AT, moist mucus membranes.  Trachea midline, no masses. Cardiovascular: No clubbing, cyanosis, or edema. Respiratory: Normal respiratory effort, no increased work of breathing. GI: Abdomen is soft, non tender, non distended, no abdominal masses. Liver and spleen not palpable.  No hernias appreciated.  Stool sample for occult testing is not indicated.   GU: No CVA tenderness.  No bladder fullness or masses.  *** external genitalia, *** pubic hair distribution, no lesions.  Normal urethral meatus, no lesions, no prolapse, no discharge.   No urethral masses, tenderness and/or tenderness. No bladder fullness, tenderness or masses. *** vagina mucosa, *** estrogen effect, no discharge, no  lesions, *** pelvic support, *** cystocele and *** rectocele noted.  No cervical motion tenderness.  Uterus is freely mobile and non-fixed.  No adnexal/parametria masses or tenderness noted.  Anus and perineum are without rashes or lesions.   ***  Skin: No rashes, bruises or suspicious lesions. Lymph: No cervical or inguinal adenopathy. Neurologic: Grossly intact, no focal deficits, moving all 4 extremities. Psychiatric: Normal mood and affect.   Laboratory Data: Lab Results  Component Value Date   WBC 4.7 07/12/2020   HGB 13.5 07/12/2020   HCT 40.3 07/12/2020   MCV 88.8 07/12/2020   PLT 208 07/12/2020    Lab Results  Component Value Date   CREATININE 0.73 07/12/2020    Lab Results  Component Value Date   AST 32 07/12/2020   Lab Results  Component Value Date   ALT 37 07/12/2020    Urinalysis    Component Value Date/Time   COLORURINE YELLOW (A) 07/12/2020 0424   APPEARANCEUR Cloudy (A) 08/25/2020 1305   LABSPEC 1.020 07/12/2020 0424   PHURINE 5.0 07/12/2020 0424   GLUCOSEU  Negative 08/25/2020 1305   HGBUR LARGE (A) 07/12/2020 0424   BILIRUBINUR Negative 08/25/2020 1305   KETONESUR 5 (A) 07/12/2020 0424   PROTEINUR 3+ (A) 08/25/2020 1305   PROTEINUR 30 (A) 07/12/2020 0424   UROBILINOGEN 1.0 05/26/2018 1115   NITRITE Positive (A) 08/25/2020 1305   NITRITE NEGATIVE 07/12/2020 0424   LEUKOCYTESUR 3+ (A) 08/25/2020 1305   LEUKOCYTESUR NEGATIVE 07/12/2020 0424  I have reviewed the labs.   Pertinent Imaging: CLINICAL DATA:  History of right ureteral stone  EXAM: RENAL / URINARY TRACT ULTRASOUND COMPLETE  COMPARISON:  08/10/2020  FINDINGS: Right Kidney:  Renal measurements: 11.2 x 4.0 x 4.5 cm. = volume: 108 mL. Minimal fullness of the renal pelvis is noted. This has improved in the interval from the prior exam. No mass lesion is noted.  Left Kidney:  Renal measurements: 10.2 x 5.2 x 4.9 cm. = volume: 137 mL. 8 mm cyst is noted within the left kidney. No mass lesion or hydronephrosis is noted.  Bladder:  Bladder is decompressed.  Other:  None.  IMPRESSION: Improved right-sided hydronephrosis with only minimal fullness of the renal pelvis.  Left renal cyst.   Electronically Signed   By: Alcide Clever M.D.   On: 09/14/2020 21:46 I have independently reviewed the films.  See HPI.    Assessment & Plan:  ***  1. Right hydronephrosis - repeat RUS in one month  2. Nephrolithiasis - ***    No follow-ups on file.  These notes generated with voice recognition software. I apologize for typographical errors.  Michiel Cowboy, PA-C  [facility] [address]  [address] [place], [place] [address] [phone]

## 2020-09-22 ENCOUNTER — Encounter: Payer: Self-pay | Admitting: Urology

## 2020-09-22 ENCOUNTER — Ambulatory Visit: Payer: PPO | Admitting: Urology

## 2020-09-22 DIAGNOSIS — N1330 Unspecified hydronephrosis: Secondary | ICD-10-CM

## 2020-09-22 DIAGNOSIS — N200 Calculus of kidney: Secondary | ICD-10-CM

## 2020-09-23 ENCOUNTER — Ambulatory Visit: Payer: Self-pay | Admitting: Urology

## 2020-09-30 ENCOUNTER — Encounter: Payer: Self-pay | Admitting: Physician Assistant

## 2020-09-30 ENCOUNTER — Other Ambulatory Visit: Payer: Self-pay

## 2020-09-30 ENCOUNTER — Ambulatory Visit (INDEPENDENT_AMBULATORY_CARE_PROVIDER_SITE_OTHER): Payer: PPO | Admitting: Physician Assistant

## 2020-09-30 VITALS — BP 134/76 | HR 64 | Ht 63.0 in | Wt 147.0 lb

## 2020-09-30 DIAGNOSIS — N201 Calculus of ureter: Secondary | ICD-10-CM | POA: Diagnosis not present

## 2020-09-30 DIAGNOSIS — N200 Calculus of kidney: Secondary | ICD-10-CM

## 2020-09-30 NOTE — Progress Notes (Signed)
09/30/2020 10:30 AM   Valerie Ford 1956/04/21 [id number]  CC: Chief Complaint  Patient presents with  . Nephrolithiasis  . Results   HPI: Valerie Ford is a 65 y.o. female with recurrent stone disease and history of Roux-en-Y gastric bypass who recently underwent ureteroscopy for management of a 4 mm proximal right ureteral stone who presents today for postop follow-up.  She underwent cystoscopy stent removal with Dr. Lonna Cobb on 08/25/2020 without difficulty.  Today she reports feeling well.  She has no residual flank pain or gross hematuria.  She underwent renal ultrasound on 09/14/2020 with minimal fullness of the right renal pelvis, significantly improved over prior.  Stone analysis with 10% calcium oxalate monohydrate, 90% calcium oxalate dihydrate.  In-office UA today pan negative; urine microscopy with calcium oxalate crystals.   PMH: Past Medical History:  Diagnosis Date  . ADHD 01/08/2018  . COPD (chronic obstructive pulmonary disease) (HCC)   . COVID-19   . Fatty liver 01/07/2018  . GERD (gastroesophageal reflux disease) 01/07/2018  . History of bariatric surgery 01/07/2018  . History of cocaine use, remote, > 20 years 01/11/2018  . History of kidney stones   . Migraine with aura and without status migrainosus, not intractable 01/11/2018  . Obstructive sleep apnea syndrome 10/20/2016  . Osteoarthritis of right knee 01/07/2018  . Sacroiliitis (HCC) 01/07/2018  . Seasonal allergic rhinitis due to pollen 01/11/2018  . Vitamin D deficiency 01/11/2018    Surgical History: Past Surgical History:  Procedure Laterality Date  . CESAREAN SECTION    . CYSTOSCOPY/URETEROSCOPY/HOLMIUM LASER/STENT PLACEMENT Left 05/15/2018   Procedure: CYSTOSCOPY/URETEROSCOPY/HOLMIUM LASER/STONE REMOVAL/STENT PLACEMENT;  Surgeon: Riki Altes, MD;  Location: [facility] OR[facility];  Service: Urology;  Laterality: Left;  . CYSTOSCOPY/URETEROSCOPY/HOLMIUM LASER/STENT PLACEMENT Right 08/18/2020   Procedure:  CYSTOSCOPY/URETEROSCOPY/HOLMIUM LASER/STONE REMOVAL/STENT PLACEMENT;  Surgeon: Riki Altes, MD;  Location: [facility] ORS;  Service: Urology;  Laterality: Right;  . GASTRIC ROUX-EN-Y    . NASAL SINUS SURGERY      Home Medications:  Allergies as of 09/30/2020      Reactions   Gadolinium Nausea And Vomiting   Sulfa Antibiotics Hives, Rash   Red and swollen ears   Codeine Nausea And Vomiting   Iodinated Diagnostic Agents Nausea And Vomiting   Nausea and vomiting after IV contast dye approximately 35 years ago.   Poison Ivy Extract Rash   Severe rash   Tramadol Nausea And Vomiting   Migraine      Medication List       Accurate as of September 30, 2020 10:30 AM. If you have any questions, ask your nurse or doctor.        acetaminophen 500 MG tablet Commonly known as: TYLENOL Take 1,000 mg by mouth every 6 (six) hours as needed for moderate pain or headache.   albuterol 108 (90 Base) MCG/ACT inhaler Commonly known as: VENTOLIN HFA Inhale 2 puffs into the lungs every 6 (six) hours as needed for wheezing or shortness of breath.   amphetamine-dextroamphetamine 10 MG tablet Commonly known as: ADDERALL Take 10 mg by mouth 3 (three) times daily.   baclofen 10 MG tablet Commonly known as: LIORESAL Take 10 mg by mouth at bedtime.   celecoxib 200 MG capsule Commonly known as: CELEBREX Take 200 mg by mouth daily.   cholecalciferol 25 MCG (1000 UNIT) tablet Commonly known as: VITAMIN D Take 1,000 Units by mouth daily.   COLLAGEN PO Take 1 Scoop by mouth daily.   FLUoxetine 20 MG capsule Commonly  known as: PROZAC Take 20 mg by mouth daily.   fluticasone 50 MCG/ACT nasal spray Commonly known as: FLONASE Place 1 spray into both nostrils daily as needed for allergies or rhinitis.   furosemide 20 MG tablet Commonly known as: LASIX Take 20 mg by mouth daily as needed (swelling).   ketorolac 10 MG tablet Commonly known as: TORADOL Take 10 mg by mouth daily as needed for moderate  pain.   multivitamin with minerals Tabs tablet Take 1 tablet by mouth daily.   omeprazole 40 MG capsule Commonly known as: PRILOSEC Take 40 mg by mouth daily.   ondansetron 4 MG disintegrating tablet Commonly known as: Zofran ODT Take 1 tablet (4 mg total) by mouth every 8 (eight) hours as needed. What changed: reasons to take this   oxybutynin 5 MG tablet Commonly known as: DITROPAN 1 tab tid prn frequency,urgency, bladder spasm   oxyCODONE-acetaminophen 5-325 MG tablet Commonly known as: Percocet Take 0.5-1 tablets by mouth every 6 (six) hours as needed for severe pain.   potassium chloride 10 MEQ tablet Commonly known as: KLOR-CON Take 10 mEq by mouth daily as needed (when taking furosemide.).   Spiriva HandiHaler 18 MCG inhalation capsule Generic drug: tiotropium Place 1 capsule into inhaler and inhale daily.   tamsulosin 0.4 MG Caps capsule Commonly known as: FLOMAX Take 1 capsule (0.4 mg total) by mouth daily.   traZODone 100 MG tablet Commonly known as: DESYREL Take 100 mg by mouth at bedtime.   Ubrelvy 50 MG Tabs Generic drug: Ubrogepant Take 50 mg by mouth daily as needed (migraine).   verapamil 80 MG tablet Commonly known as: CALAN Take 80 mg by mouth 2 (two) times daily.       Allergies:  Allergies  Allergen Reactions  . Gadolinium Nausea And Vomiting  . Sulfa Antibiotics Hives and Rash    Red and swollen ears  . Codeine Nausea And Vomiting  . Iodinated Diagnostic Agents Nausea And Vomiting    Nausea and vomiting after IV contast dye approximately 35 years ago.  . Poison Ivy Extract Rash    Severe rash  . Tramadol Nausea And Vomiting    Migraine     Family History: Family History  Problem Relation Age of Onset  . Breast cancer Maternal Grandmother 30  . Arthritis Maternal Grandmother   . Asthma Maternal Grandmother   . Depression Maternal Grandmother   . Heart attack Maternal Grandmother   . Arthritis Mother   . Asthma Mother   .  COPD Mother   . High Cholesterol Mother   . Hypertension Mother   . Stroke Mother   . Heart disease Father   . Hypertension Father   . Alcohol abuse Sister   . COPD Sister   . Alcohol abuse Brother   . Depression Brother   . Drug abuse Brother     Social History:   reports that she has quit smoking. She has never used smokeless tobacco. She reports previous drug use. She reports that she does not drink alcohol.  Physical Exam: BP 134/76   Pulse 64   Ht 5\' 3"  (1.6 m)   Wt 147 lb (66.7 kg)   LMP 03/17/2005 (Approximate)   BMI 26.04 kg/m   Constitutional:  Alert and oriented, no acute distress, nontoxic appearing HEENT: [place], AT Cardiovascular: No clubbing, cyanosis, or edema Respiratory: Normal respiratory effort, no increased work of breathing Skin: No rashes, bruises or suspicious lesions Neurologic: Grossly intact, no focal deficits, moving all 4  extremities Psychiatric: Normal mood and affect  Laboratory Data: Results for orders placed or performed in visit on 09/30/20  Microscopic Examination   Urine  Result Value Ref Range   WBC, UA None seen 0 - 5 /hpf   RBC None seen 0 - 2 /hpf   Epithelial Cells (non renal) 0-10 0 - 10 /hpf   Crystals Present (A) N/A   Crystal Type Calcium Oxalate N/A   Mucus, UA Present (A) Not Estab.   Bacteria, UA None seen None seen/Few  Urinalysis, Complete  Result Value Ref Range   Specific Gravity, UA 1.025 1.005 - 1.030   pH, UA 6.0 5.0 - 7.5   Color, UA Yellow Yellow   Appearance Ur Hazy (A) Clear   Leukocytes,UA Negative Negative   Protein,UA Negative Negative/Trace   Glucose, UA Negative Negative   Ketones, UA Negative Negative   RBC, UA Negative Negative   Bilirubin, UA Negative Negative   Urobilinogen, Ur 0.2 0.2 - 1.0 mg/dL   Nitrite, UA Negative Negative   Microscopic Examination See below:    Pertinent Imaging: Results for orders placed during the hospital encounter of 09/14/20  Ultrasound renal  complete  Narrative CLINICAL DATA:  History of right ureteral stone  EXAM: RENAL / URINARY TRACT ULTRASOUND COMPLETE  COMPARISON:  08/10/2020  FINDINGS: Right Kidney:  Renal measurements: 11.2 x 4.0 x 4.5 cm. = volume: 108 mL. Minimal fullness of the renal pelvis is noted. This has improved in the interval from the prior exam. No mass lesion is noted.  Left Kidney:  Renal measurements: 10.2 x 5.2 x 4.9 cm. = volume: 137 mL. 8 mm cyst is noted within the left kidney. No mass lesion or hydronephrosis is noted.  Bladder:  Bladder is decompressed.  Other:  None.  IMPRESSION: Improved right-sided hydronephrosis with only minimal fullness of the renal pelvis.  Left renal cyst.   Electronically Signed By: Alcide Clever M.D. On: 09/14/2020 21:46  I personally reviewed the images referenced above and note improved right hydronephrosis with some mild residual renal pelvic fullness.  Assessment & Plan:   1. Right ureteral stone Symptoms resolved, hydronephrosis resolved, UA benign today.  No further intervention indicated.  Stone analysis with calcium oxalate. - Urinalysis, Complete  2. Recurrent nephrolithiasis We discussed stone prevention guidelines today including increasing oral hydration, decreasing dietary oxalate, maintaining moderate dietary calcium, decreasing dietary sodium, and increasing dietary citrate.  Written and verbal references provided today.  I offered the patient a nutrition referral given her history of gastric bypass, but she prefers to defer this.  I am in agreement with this plan.  Return if symptoms worsen or fail to improve.  Carman Ching, PA-C  [facility] [address], [address] [place], [place] [address] [phone]

## 2020-10-01 DIAGNOSIS — J301 Allergic rhinitis due to pollen: Secondary | ICD-10-CM | POA: Diagnosis not present

## 2020-10-01 LAB — URINALYSIS, COMPLETE
Bilirubin, UA: NEGATIVE
Glucose, UA: NEGATIVE
Ketones, UA: NEGATIVE
Leukocytes,UA: NEGATIVE
Nitrite, UA: NEGATIVE
Protein,UA: NEGATIVE
RBC, UA: NEGATIVE
Specific Gravity, UA: 1.025 (ref 1.005–1.030)
Urobilinogen, Ur: 0.2 mg/dL (ref 0.2–1.0)
pH, UA: 6 (ref 5.0–7.5)

## 2020-10-01 LAB — MICROSCOPIC EXAMINATION
Bacteria, UA: NONE SEEN
RBC, Urine: NONE SEEN /hpf (ref 0–2)
WBC, UA: NONE SEEN /hpf (ref 0–5)

## 2020-10-30 DIAGNOSIS — Z1231 Encounter for screening mammogram for malignant neoplasm of breast: Secondary | ICD-10-CM | POA: Diagnosis not present

## 2020-10-30 DIAGNOSIS — Z0000 Encounter for general adult medical examination without abnormal findings: Secondary | ICD-10-CM | POA: Diagnosis not present

## 2020-10-30 DIAGNOSIS — Z01419 Encounter for gynecological examination (general) (routine) without abnormal findings: Secondary | ICD-10-CM | POA: Diagnosis not present

## 2020-10-30 DIAGNOSIS — J431 Panlobular emphysema: Secondary | ICD-10-CM | POA: Diagnosis not present

## 2020-10-30 DIAGNOSIS — E162 Hypoglycemia, unspecified: Secondary | ICD-10-CM | POA: Diagnosis not present

## 2020-10-30 DIAGNOSIS — E559 Vitamin D deficiency, unspecified: Secondary | ICD-10-CM | POA: Diagnosis not present

## 2020-10-30 DIAGNOSIS — F902 Attention-deficit hyperactivity disorder, combined type: Secondary | ICD-10-CM | POA: Diagnosis not present

## 2020-10-30 DIAGNOSIS — G43109 Migraine with aura, not intractable, without status migrainosus: Secondary | ICD-10-CM | POA: Diagnosis not present

## 2020-11-13 ENCOUNTER — Encounter: Payer: Self-pay | Admitting: *Deleted

## 2020-11-16 ENCOUNTER — Ambulatory Visit: Payer: PPO | Admitting: Anesthesiology

## 2020-11-16 ENCOUNTER — Ambulatory Visit: Admission: RE | Admit: 2020-11-16 | Payer: PPO | Source: Home / Self Care

## 2020-11-16 ENCOUNTER — Other Ambulatory Visit: Payer: Self-pay

## 2020-11-16 ENCOUNTER — Encounter: Payer: Self-pay | Admitting: *Deleted

## 2020-11-16 ENCOUNTER — Ambulatory Visit
Admission: RE | Admit: 2020-11-16 | Discharge: 2020-11-16 | Disposition: A | Discharge status: Home / Self Care | Payer: PPO | Attending: Gastroenterology | Admitting: Gastroenterology

## 2020-11-16 ENCOUNTER — Encounter
Admission: RE | Disposition: A | Discharge status: Home / Self Care | Payer: Self-pay | Source: Home / Self Care | Attending: Gastroenterology

## 2020-11-16 ENCOUNTER — Encounter: Admission: RE | Payer: Self-pay | Source: Home / Self Care

## 2020-11-16 DIAGNOSIS — Z9884 Bariatric surgery status: Secondary | ICD-10-CM | POA: Diagnosis not present

## 2020-11-16 DIAGNOSIS — R1310 Dysphagia, unspecified: Secondary | ICD-10-CM | POA: Insufficient documentation

## 2020-11-16 DIAGNOSIS — Z791 Long term (current) use of non-steroidal anti-inflammatories (NSAID): Secondary | ICD-10-CM | POA: Diagnosis not present

## 2020-11-16 DIAGNOSIS — K760 Fatty (change of) liver, not elsewhere classified: Secondary | ICD-10-CM | POA: Insufficient documentation

## 2020-11-16 DIAGNOSIS — K2289 Other specified disease of esophagus: Secondary | ICD-10-CM | POA: Insufficient documentation

## 2020-11-16 DIAGNOSIS — Z980 Intestinal bypass and anastomosis status: Secondary | ICD-10-CM | POA: Insufficient documentation

## 2020-11-16 DIAGNOSIS — Z882 Allergy status to sulfonamides status: Secondary | ICD-10-CM | POA: Insufficient documentation

## 2020-11-16 DIAGNOSIS — Z79899 Other long term (current) drug therapy: Secondary | ICD-10-CM | POA: Insufficient documentation

## 2020-11-16 DIAGNOSIS — Z885 Allergy status to narcotic agent status: Secondary | ICD-10-CM | POA: Diagnosis not present

## 2020-11-16 DIAGNOSIS — Z8616 Personal history of COVID-19: Secondary | ICD-10-CM | POA: Diagnosis not present

## 2020-11-16 DIAGNOSIS — Z91041 Radiographic dye allergy status: Secondary | ICD-10-CM | POA: Insufficient documentation

## 2020-11-16 HISTORY — PX: ESOPHAGOGASTRODUODENOSCOPY: SHX5428

## 2020-11-16 SURGERY — EGD (ESOPHAGOGASTRODUODENOSCOPY)
Anesthesia: General

## 2020-11-16 SURGERY — ESOPHAGOGASTRODUODENOSCOPY (EGD) WITH PROPOFOL
Anesthesia: General

## 2020-11-16 MED ORDER — SODIUM CHLORIDE 0.9 % IV SOLN
INTRAVENOUS | Status: DC
  Administered 2020-11-16: 20 mL/h via INTRAVENOUS

## 2020-11-16 MED ORDER — PROPOFOL 500 MG/50ML IV EMUL
INTRAVENOUS | Status: DC | PRN
  Administered 2020-11-16: 120 ug/kg/min via INTRAVENOUS

## 2020-11-16 NOTE — Op Note (Signed)
[facility] Patient Name: Valerie Ford Procedure Date: 11/16/2020 10:51 AM MRN: [id number] Account #: [id number] Date of Birth: 1956/02/04 Admit Type: Outpatient Age: 65 Room: [facility] ENDO ROOM 3 Gender: Female Note Status: Finalized Procedure:             Upper GI endoscopy Indications:           Dysphagia Providers:             Andrey Farmer MD, MD Referring MD:          Leonie Douglas. Doy Hutching, MD (Referring MD) Medicines:             Monitored Anesthesia Care Complications:         No immediate complications. Estimated blood loss:                         Minimal. Procedure:             Pre-Anesthesia Assessment:                        - Prior to the procedure, a History and Physical was                         performed, and patient medications and allergies were                         reviewed. The patient is competent. The risks and                         benefits of the procedure and the sedation options and                         risks were discussed with the patient. All questions                         were answered and informed consent was obtained.                         Patient identification and proposed procedure were                         verified by the physician, the nurse, the anesthetist                         and the technician in the endoscopy suite. Mental                         Status Examination: alert and oriented. Airway                         Examination: normal oropharyngeal airway and neck                         mobility. Respiratory Examination: clear to                         auscultation. CV Examination: normal. Prophylactic                         Antibiotics:  The patient does not require prophylactic                         antibiotics. Prior Anticoagulants: The patient has                         taken no previous anticoagulant or antiplatelet                         agents. ASA Grade Assessment: II - A  patient with mild                         systemic disease. After reviewing the risks and                         benefits, the patient was deemed in satisfactory                         condition to undergo the procedure. The anesthesia                         plan was to use monitored anesthesia care (MAC).                         Immediately prior to administration of medications,                         the patient was re-assessed for adequacy to receive                         sedatives. The heart rate, respiratory rate, oxygen                         saturations, blood pressure, adequacy of pulmonary                         ventilation, and response to care were monitored                         throughout the procedure. The physical status of the                         patient was re-assessed after the procedure.                        After obtaining informed consent, the endoscope was                         passed under direct vision. Throughout the procedure,                         the patient's blood pressure, pulse, and oxygen                         saturations were monitored continuously. The Endoscope                         was introduced through the mouth, and advanced to the  second part of duodenum. The upper GI endoscopy was                         accomplished without difficulty. The patient tolerated                         the procedure well. Findings:      The examined esophagus was normal. Biopsies were obtained from the       proximal and distal esophagus with cold forceps for histology of       suspected eosinophilic esophagitis. Estimated blood loss was minimal.      Evidence of a Roux-en-Y gastrojejunostomy was found. The jejunojejunal       anastomosis was characterized by healthy appearing mucosa.      Normal mucosa was found in the entire examined stomach. Biopsies were       taken with a cold forceps for Helicobacter pylori  testing. Estimated       blood loss was minimal.      The exam was otherwise without abnormality. Impression:            - Normal esophagus. Biopsied.                        - Roux-en-Y gastrojejunostomy.                        - Normal mucosa was found in the entire stomach.                         Biopsied.                        - The examination was otherwise normal. Recommendation:        - Discharge patient to home.                        - Resume previous diet.                        - Continue present medications.                        - Await pathology results.                        - Return to referring physician as previously                         scheduled. Procedure Code(s):     --- Professional ---                        [phone], Esophagogastroduodenoscopy, flexible,                         transoral; with biopsy, single or multiple Diagnosis Code(s):     --- Professional ---                        Z98.0, Intestinal bypass and anastomosis status                        R13.10, Dysphagia, unspecified CPT  copyright 2019 American Medical Association. All rights reserved. The codes documented in this report are preliminary and upon coder review may  be revised to meet current compliance requirements. Andrey Farmer MD, MD 11/16/2020 11:07:19 AM Number of Addenda: 0 Note Initiated On: 11/16/2020 10:51 AM Estimated Blood Loss:  Estimated blood loss was minimal.      [facility]

## 2020-11-16 NOTE — Transfer of Care (Signed)
Immediate Anesthesia Transfer of Care Note  Patient: Valerie Ford  Procedure(s) Performed: ESOPHAGOGASTRODUODENOSCOPY (EGD) (N/A )  Patient Location: PACU  Anesthesia Type:General  Level of Consciousness: awake and sedated  Airway & Oxygen Therapy: Patient Spontanous Breathing and Patient connected to nasal cannula oxygen  Post-op Assessment: Report given to RN and Post -op Vital signs reviewed and stable  Post vital signs: Reviewed and stable  Last Vitals:  Vitals Value Taken Time  BP    Temp    Pulse    Resp    SpO2      Last Pain:  Vitals:   11/16/20 0951  TempSrc: Temporal  PainSc: 0-No pain         Complications: No complications documented.

## 2020-11-16 NOTE — H&P (Signed)
Outpatient short stay form Pre-procedure 11/16/2020 10:48 AM Valerie Lot MD, MPH  Primary Physician: Dr. Judithann Sheen  Reason for visit:  Dysphagia  History of present illness:   65 y/o lady with history of roux-en-y gastric bypass here for EGD for dysphagia symptoms. No blood thinners. No first degree relatives with GI malignancies.    Current Facility-Administered Medications:  .  0.9 %  sodium chloride infusion, , Intravenous, Continuous, Barbera Perritt, Rossie Muskrat, MD, Last Rate: 20 mL/hr at 11/16/20 1003, 20 mL/hr at 11/16/20 1003  Medications Prior to Admission  Medication Sig Dispense Refill Last Dose  . acetaminophen (TYLENOL) 500 MG tablet Take 1,000 mg by mouth every 6 (six) hours as needed for moderate pain or headache.   11/15/2020 at Unknown time  . albuterol (VENTOLIN HFA) 108 (90 Base) MCG/ACT inhaler Inhale 2 puffs into the lungs every 6 (six) hours as needed for wheezing or shortness of breath.   11/15/2020 at Unknown time  . amphetamine-dextroamphetamine (ADDERALL) 10 MG tablet Take 10 mg by mouth 3 (three) times daily.  0 11/15/2020 at Unknown time  . baclofen (LIORESAL) 10 MG tablet Take 10 mg by mouth at bedtime.   11/15/2020 at Unknown time  . celecoxib (CELEBREX) 200 MG capsule Take 200 mg by mouth daily.    11/15/2020 at Unknown time  . cholecalciferol (VITAMIN D) 25 MCG (1000 UNIT) tablet Take 1,000 Units by mouth daily.   11/15/2020 at Unknown time  . COLLAGEN PO Take 1 Scoop by mouth daily.   11/15/2020 at Unknown time  . FLUoxetine (PROZAC) 20 MG capsule Take 20 mg by mouth daily.   11/15/2020 at Unknown time  . fluticasone (FLONASE) 50 MCG/ACT nasal spray Place 1 spray into both nostrils daily as needed for allergies or rhinitis.   11/15/2020 at Unknown time  . furosemide (LASIX) 20 MG tablet Take 20 mg by mouth daily as needed (swelling).    11/15/2020 at Unknown time  . ketorolac (TORADOL) 10 MG tablet Take 10 mg by mouth daily as needed for moderate pain.   11/15/2020 at Unknown time  .  Multiple Vitamin (MULTIVITAMIN WITH MINERALS) TABS tablet Take 1 tablet by mouth daily.   11/15/2020 at Unknown time  . omeprazole (PRILOSEC) 40 MG capsule Take 40 mg by mouth daily.   11/15/2020 at Unknown time  . ondansetron (ZOFRAN ODT) 4 MG disintegrating tablet Take 1 tablet (4 mg total) by mouth every 8 (eight) hours as needed. (Patient taking differently: Take 4 mg by mouth every 8 (eight) hours as needed for nausea or vomiting.) 20 tablet 0 Past Week at Unknown time  . potassium chloride (K-DUR) 10 MEQ tablet Take 10 mEq by mouth daily as needed (when taking furosemide.).   11/15/2020 at Unknown time  . SPIRIVA HANDIHALER 18 MCG inhalation capsule Place 1 capsule into inhaler and inhale daily.   11/15/2020 at Unknown time  . traZODone (DESYREL) 100 MG tablet Take 100 mg by mouth at bedtime.   11/15/2020 at Unknown time  . verapamil (CALAN) 80 MG tablet Take 80 mg by mouth 2 (two) times daily.   11/15/2020 at Unknown time  . oxybutynin (DITROPAN) 5 MG tablet 1 tab tid prn frequency,urgency, bladder spasm (Patient not taking: Reported on 09/30/2020) 30 tablet 0   . tamsulosin (FLOMAX) 0.4 MG CAPS capsule Take 1 capsule (0.4 mg total) by mouth daily. (Patient not taking: Reported on 09/30/2020) 30 capsule 0   . Ubrogepant (UBRELVY) 50 MG TABS Take 50 mg by mouth daily  as needed (migraine).        Allergies  Allergen Reactions  . Gadolinium Nausea And Vomiting  . Sulfa Antibiotics Hives and Rash    Red and swollen ears  . Codeine Nausea And Vomiting  . Iodinated Diagnostic Agents Nausea And Vomiting    Nausea and vomiting after IV contast dye approximately 35 years ago.  . Poison Ivy Extract Rash    Severe rash  . Tramadol Nausea And Vomiting    Migraine      Past Medical History:  Diagnosis Date  . ADHD 01/08/2018  . COPD (chronic obstructive pulmonary disease) (HCC)   . COVID-19   . Fatty liver 01/07/2018  . GERD (gastroesophageal reflux disease) 01/07/2018  . History of bariatric surgery  01/07/2018  . History of cocaine use, remote, > 20 years 01/11/2018  . History of kidney stones   . Migraine with aura and without status migrainosus, not intractable 01/11/2018  . Obstructive sleep apnea syndrome 10/20/2016  . Osteoarthritis of right knee 01/07/2018  . Sacroiliitis (HCC) 01/07/2018  . Seasonal allergic rhinitis due to pollen 01/11/2018  . Vitamin D deficiency 01/11/2018    Review of systems:  Otherwise negative.    Physical Exam  Gen: Alert, oriented. Appears stated age.  HEENT: PERRLA. Lungs: No respiratory distress CV: RRR. Abd: soft, benign, no masses Ext: No edema    Planned procedures: Proceed with EGD. The patient understands the nature of the planned procedure, indications, risks, alternatives and potential complications including but not limited to bleeding, infection, perforation, damage to internal organs and possible oversedation/side effects from anesthesia. The patient agrees and gives consent to proceed.  Please refer to procedure notes for findings, recommendations and patient disposition/instructions.     Valerie Lot MD, MPH Gastroenterology 11/16/2020  10:48 AM

## 2020-11-16 NOTE — Anesthesia Preprocedure Evaluation (Signed)
Anesthesia Evaluation  Patient identified by MRN, date of birth, ID band Patient awake    Reviewed: Allergy & Precautions, NPO status , Patient's Chart, lab work & pertinent test results  History of Anesthesia Complications Negative for: history of anesthetic complications  Airway Mallampati: III  TM Distance: >3 FB Neck ROM: Full    Dental no notable dental hx. 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   breath sounds clear to auscultation- rhonchi (-) wheezing      Cardiovascular Exercise Tolerance: Good (-) hypertension(-) CAD, (-) Past MI, (-) Cardiac Stents and (-) CABG  Rhythm:Regular Rate:Normal - Systolic murmurs and - Diastolic murmurs    Neuro/Psych  Headaches, PSYCHIATRIC DISORDERS (ADHD) Anxiety Depression  Neuromuscular disease    GI/Hepatic Neg liver ROS, GERD  Medicated and Controlled,  Endo/Other  negative endocrine ROSneg diabetes  Renal/GU negative Renal ROS     Musculoskeletal  (+) Arthritis ,   Abdominal (+) - obese,   Peds  Hematology negative hematology ROS (+)   Anesthesia Other Findings Past Medical History: 01/08/2018: ADHD 01/07/2018: Fatty liver 01/07/2018: GERD (gastroesophageal reflux disease) 01/07/2018: History of bariatric surgery 01/11/2018: History of cocaine use, remote, > 20 years 01/11/2018: Migraine with aura and without status migrainosus, not  intractable 10/20/2016: Obstructive sleep apnea syndrome 01/07/2018: Osteoarthritis of right knee 01/07/2018: Sacroiliitis (HCC) 01/11/2018: Seasonal allergic rhinitis due to pollen 01/11/2018: Vitamin D deficiency   Reproductive/Obstetrics                             Anesthesia Physical  Anesthesia Plan  ASA:  II  Anesthesia Plan: General   Post-op Pain Management:    Induction: Intravenous  PONV Risk Score and Plan: 2 and TIVA and Propofol infusion  Airway Management Planned: Natural Airway and Nasal Cannula  Additional Equipment: None  Intra-op Plan:   Post-operative Plan: Extubation in OR  Informed Consent: I have reviewed the patients History and Physical, chart, labs and discussed the procedure including the risks, benefits and alternatives for the proposed anesthesia with the patient or authorized representative who has indicated his/her understanding and acceptance.     Dental advisory given  Plan Discussed with: CRNA and Anesthesiologist  Anesthesia Plan Comments: (Discussed risks of anesthesia with patient, including PONV, sore throat, lip/dental damage. Rare risks discussed as well, such as cardiorespiratory and neurological sequelae. Patient understands.)        Anesthesia Quick Evaluation

## 2020-11-16 NOTE — Anesthesia Procedure Notes (Signed)
Performed by: Cook-Martin, Karyl Sharrar Pre-anesthesia Checklist: Patient identified, Emergency Drugs available, Suction available, Patient being monitored and Timeout performed Patient Re-evaluated:Patient Re-evaluated prior to induction Oxygen Delivery Method: Nasal cannula Preoxygenation: Pre-oxygenation with 100% oxygen Induction Type: IV induction Airway Equipment and Method: Bite block Placement Confirmation: positive ETCO2 and CO2 detector       

## 2020-11-16 NOTE — Interval H&P Note (Signed)
History and Physical Interval Note:  11/16/2020 10:52 AM  Valerie Ford  has presented today for surgery, with the diagnosis of DYSPHAGIA.  The various methods of treatment have been discussed with the patient and family. After consideration of risks, benefits and other options for treatment, the patient has consented to  Procedure(s): ESOPHAGOGASTRODUODENOSCOPY (EGD) (N/A) as a surgical intervention.  The patient's history has been reviewed, patient examined, no change in status, stable for surgery.  I have reviewed the patient's chart and labs.  Questions were answered to the patient's satisfaction.     Regis Bill  Ok to proceed with EGD

## 2020-11-16 NOTE — Anesthesia Postprocedure Evaluation (Signed)
Anesthesia Post Note  Patient: Valerie Ford  Procedure(s) Performed: ESOPHAGOGASTRODUODENOSCOPY (EGD) (N/A )  Patient location during evaluation: Phase II Anesthesia Type: General Level of consciousness: awake and alert, awake and oriented Pain management: pain level controlled Vital Signs Assessment: post-procedure vital signs reviewed and stable Respiratory status: spontaneous breathing, nonlabored ventilation and respiratory function stable Cardiovascular status: blood pressure returned to baseline and stable Postop Assessment: no apparent nausea or vomiting Anesthetic complications: no   No complications documented.   Last Vitals:  Vitals:   11/16/20 1110 11/16/20 1120  BP: 134/63 120/68  Pulse: 64 (!) 57  Resp: 12 17  Temp:    SpO2: 92% 98%    Last Pain:  Vitals:   11/16/20 0951  TempSrc: Temporal  PainSc: 0-No pain                 Manfred Arch

## 2020-11-17 ENCOUNTER — Encounter: Payer: Self-pay | Admitting: Gastroenterology

## 2020-11-17 LAB — SURGICAL PATHOLOGY

## 2020-12-12 DIAGNOSIS — L237 Allergic contact dermatitis due to plants, except food: Secondary | ICD-10-CM | POA: Diagnosis not present

## 2020-12-30 ENCOUNTER — Other Ambulatory Visit: Payer: Self-pay

## 2020-12-30 DIAGNOSIS — Z1231 Encounter for screening mammogram for malignant neoplasm of breast: Secondary | ICD-10-CM

## 2020-12-30 DIAGNOSIS — Z1211 Encounter for screening for malignant neoplasm of colon: Secondary | ICD-10-CM | POA: Diagnosis not present

## 2020-12-30 DIAGNOSIS — Z124 Encounter for screening for malignant neoplasm of cervix: Secondary | ICD-10-CM | POA: Diagnosis not present

## 2020-12-30 DIAGNOSIS — N941 Unspecified dyspareunia: Secondary | ICD-10-CM | POA: Diagnosis not present

## 2020-12-30 DIAGNOSIS — N952 Postmenopausal atrophic vaginitis: Secondary | ICD-10-CM | POA: Diagnosis not present

## 2021-01-06 DIAGNOSIS — H2513 Age-related nuclear cataract, bilateral: Secondary | ICD-10-CM | POA: Diagnosis not present

## 2021-01-19 DIAGNOSIS — G43119 Migraine with aura, intractable, without status migrainosus: Secondary | ICD-10-CM | POA: Diagnosis not present

## 2021-01-19 DIAGNOSIS — G4733 Obstructive sleep apnea (adult) (pediatric): Secondary | ICD-10-CM | POA: Diagnosis not present

## 2021-01-19 DIAGNOSIS — Z6824 Body mass index (BMI) 24.0-24.9, adult: Secondary | ICD-10-CM | POA: Diagnosis not present

## 2021-01-19 DIAGNOSIS — H539 Unspecified visual disturbance: Secondary | ICD-10-CM | POA: Diagnosis not present

## 2021-01-19 DIAGNOSIS — R42 Dizziness and giddiness: Secondary | ICD-10-CM | POA: Diagnosis not present

## 2021-01-19 DIAGNOSIS — G44221 Chronic tension-type headache, intractable: Secondary | ICD-10-CM | POA: Diagnosis not present

## 2021-03-17 DIAGNOSIS — M461 Sacroiliitis, not elsewhere classified: Secondary | ICD-10-CM | POA: Diagnosis not present

## 2021-04-01 DIAGNOSIS — R140 Abdominal distension (gaseous): Secondary | ICD-10-CM | POA: Diagnosis not present

## 2021-04-13 DIAGNOSIS — E559 Vitamin D deficiency, unspecified: Secondary | ICD-10-CM | POA: Diagnosis not present

## 2021-04-13 DIAGNOSIS — G43109 Migraine with aura, not intractable, without status migrainosus: Secondary | ICD-10-CM | POA: Diagnosis not present

## 2021-04-13 DIAGNOSIS — E162 Hypoglycemia, unspecified: Secondary | ICD-10-CM | POA: Diagnosis not present

## 2021-04-13 DIAGNOSIS — Z79899 Other long term (current) drug therapy: Secondary | ICD-10-CM | POA: Diagnosis not present

## 2021-04-13 DIAGNOSIS — J431 Panlobular emphysema: Secondary | ICD-10-CM | POA: Diagnosis not present

## 2021-04-13 DIAGNOSIS — M25552 Pain in left hip: Secondary | ICD-10-CM | POA: Diagnosis not present

## 2021-04-13 DIAGNOSIS — F902 Attention-deficit hyperactivity disorder, combined type: Secondary | ICD-10-CM | POA: Diagnosis not present

## 2021-04-13 DIAGNOSIS — Z1329 Encounter for screening for other suspected endocrine disorder: Secondary | ICD-10-CM | POA: Diagnosis not present

## 2021-04-30 DIAGNOSIS — M25552 Pain in left hip: Secondary | ICD-10-CM | POA: Diagnosis not present

## 2021-04-30 DIAGNOSIS — M7062 Trochanteric bursitis, left hip: Secondary | ICD-10-CM | POA: Diagnosis not present

## 2021-06-16 DIAGNOSIS — R6881 Early satiety: Secondary | ICD-10-CM | POA: Diagnosis not present

## 2021-06-21 ENCOUNTER — Other Ambulatory Visit: Payer: Self-pay | Admitting: *Deleted

## 2021-06-21 DIAGNOSIS — N201 Calculus of ureter: Secondary | ICD-10-CM

## 2021-07-20 ENCOUNTER — Other Ambulatory Visit: Payer: Self-pay | Admitting: Internal Medicine

## 2021-07-20 DIAGNOSIS — Z1231 Encounter for screening mammogram for malignant neoplasm of breast: Secondary | ICD-10-CM

## 2021-07-20 DIAGNOSIS — E162 Hypoglycemia, unspecified: Secondary | ICD-10-CM | POA: Diagnosis not present

## 2021-07-20 DIAGNOSIS — E559 Vitamin D deficiency, unspecified: Secondary | ICD-10-CM | POA: Diagnosis not present

## 2021-07-20 DIAGNOSIS — G4733 Obstructive sleep apnea (adult) (pediatric): Secondary | ICD-10-CM | POA: Diagnosis not present

## 2021-07-20 DIAGNOSIS — J431 Panlobular emphysema: Secondary | ICD-10-CM | POA: Diagnosis not present

## 2021-07-20 DIAGNOSIS — F902 Attention-deficit hyperactivity disorder, combined type: Secondary | ICD-10-CM | POA: Diagnosis not present

## 2021-07-26 ENCOUNTER — Ambulatory Visit: Payer: Self-pay | Admitting: Urology

## 2021-07-28 ENCOUNTER — Encounter: Payer: Self-pay | Admitting: Urology

## 2021-07-28 ENCOUNTER — Ambulatory Visit
Admission: RE | Admit: 2021-07-28 | Discharge: 2021-07-28 | Disposition: A | Discharge status: Home / Self Care | Payer: PPO | Source: Ambulatory Visit | Attending: Urology | Admitting: Urology

## 2021-07-28 ENCOUNTER — Ambulatory Visit
Admission: RE | Admit: 2021-07-28 | Discharge: 2021-07-28 | Disposition: A | Discharge status: Home / Self Care | Payer: PPO | Attending: Urology | Admitting: Urology

## 2021-07-28 ENCOUNTER — Ambulatory Visit: Payer: PPO | Admitting: Urology

## 2021-07-28 ENCOUNTER — Other Ambulatory Visit: Payer: Self-pay

## 2021-07-28 VITALS — BP 150/77 | HR 65 | Ht 63.0 in | Wt 127.0 lb

## 2021-07-28 DIAGNOSIS — N200 Calculus of kidney: Secondary | ICD-10-CM | POA: Diagnosis not present

## 2021-07-28 DIAGNOSIS — N201 Calculus of ureter: Secondary | ICD-10-CM

## 2021-07-28 DIAGNOSIS — M47817 Spondylosis without myelopathy or radiculopathy, lumbosacral region: Secondary | ICD-10-CM | POA: Diagnosis not present

## 2021-07-28 DIAGNOSIS — Z87442 Personal history of urinary calculi: Secondary | ICD-10-CM | POA: Diagnosis not present

## 2021-07-28 NOTE — Progress Notes (Signed)
07/28/2021 1:31 PM   Valerie Ford 1956-05-17 [id number]  Referring provider: Idelle Crouch, MD [facility] [place],   [id number]  Chief Complaint  Patient presents with   Nephrolithiasis    Urologic history: 1. recurrent stone disease -Ureteroscopic removal left mid ureteral calculus 05/2018 -Stone analysis CaOxMono/CaOxDi/CaPhos 20/70/10 -Passed stone 20+ years prior -Ureteroscopic removal of a 4 mm right proximal ureteral calculus 08/2020 -CT showed no additional renal calculi -Stone analysis CaOxMono/CaOxDi 10/90  HPI: 66 y.o. female presents for follow-up  Doing well since last visit No bothersome LUTS Denies dysuria, gross hematuria Denies flank, abdominal or pelvic pain Follow-up renal ultrasound March 2022 showed resolution of her right hydronephrosis  PMH: Past Medical History:  Diagnosis Date   ADHD 01/08/2018   COPD (chronic obstructive pulmonary disease) ([place])    COVID-19    Fatty liver 01/07/2018   GERD (gastroesophageal reflux disease) 01/07/2018   History of bariatric surgery 01/07/2018   History of cocaine use, remote, > 20 years 01/11/2018   History of kidney stones    Migraine with aura and without status migrainosus, not intractable 01/11/2018   Obstructive sleep apnea syndrome 10/20/2016   Osteoarthritis of right knee 01/07/2018   Sacroiliitis ([place]) 01/07/2018   Seasonal allergic rhinitis due to pollen 01/11/2018   Vitamin D deficiency 01/11/2018    Surgical History: Past Surgical History:  Procedure Laterality Date   CESAREAN SECTION     CYSTOSCOPY/URETEROSCOPY/HOLMIUM LASER/STENT PLACEMENT Left 05/15/2018   Procedure: CYSTOSCOPY/URETEROSCOPY/HOLMIUM LASER/STONE REMOVAL/STENT PLACEMENT;  Surgeon: Abbie Sons, MD;  Location: [facility] ORS;  Service: Urology;  Laterality: Left;   CYSTOSCOPY/URETEROSCOPY/HOLMIUM LASER/STENT PLACEMENT Right 08/18/2020   Procedure: CYSTOSCOPY/URETEROSCOPY/HOLMIUM LASER/STONE REMOVAL/STENT  PLACEMENT;  Surgeon: Abbie Sons, MD;  Location: [facility] ORS;  Service: Urology;  Laterality: Right;   ESOPHAGOGASTRODUODENOSCOPY N/A 11/16/2020   Procedure: ESOPHAGOGASTRODUODENOSCOPY (EGD);  Surgeon: Lesly Rubenstein, MD;  Location[facility] ENDOSCOPY;  Service: Endoscopy;  Laterality: N/A;   GASTRIC ROUX-EN-Y     NASAL SINUS SURGERY      Home Medications:  Allergies as of 07/28/2021       Reactions   Gadolinium Nausea And Vomiting   Sulfa Antibiotics Hives, Rash   Red and swollen ears   Codeine Nausea And Vomiting   Iodinated Contrast Media Nausea And Vomiting   Nausea and vomiting after IV contast dye approximately 35 years ago.   Poison Ivy Extract Rash   Severe rash   Tramadol Nausea And Vomiting   Migraine        Medication List        Accurate as of July 28, 2021  1:31 PM. If you have any questions, ask your nurse or doctor.          STOP taking these medications    ketorolac 10 MG tablet Commonly known as: TORADOL Stopped by: Abbie Sons, MD   oxybutynin 5 MG tablet Commonly known as: DITROPAN Stopped by: Abbie Sons, MD   tamsulosin 0.4 MG Caps capsule Commonly known as: FLOMAX Stopped by: Abbie Sons, MD       TAKE these medications    acetaminophen 500 MG tablet Commonly known as: TYLENOL Take 1,000 mg by mouth every 6 (six) hours as needed for moderate pain or headache.   albuterol 108 (90 Base) MCG/ACT inhaler Commonly known as: VENTOLIN HFA Inhale 2 puffs into the lungs every 6 (six) hours as needed for wheezing or shortness of breath.   amphetamine-dextroamphetamine 10 MG  tablet Commonly known as: ADDERALL Take 10 mg by mouth 3 (three) times daily.   baclofen 10 MG tablet Commonly known as: LIORESAL Take 10 mg by mouth at bedtime.   celecoxib 200 MG capsule Commonly known as: CELEBREX Take 200 mg by mouth daily.   cholecalciferol 25 MCG (1000 UNIT) tablet Commonly known as: VITAMIN D Take 1,000 Units by mouth  daily.   COLLAGEN PO Take 1 Scoop by mouth daily.   FLUoxetine 20 MG capsule Commonly known as: PROZAC Take 20 mg by mouth daily.   fluticasone 50 MCG/ACT nasal spray Commonly known as: FLONASE Place 1 spray into both nostrils daily as needed for allergies or rhinitis.   furosemide 20 MG tablet Commonly known as: LASIX Take 20 mg by mouth daily as needed (swelling).   multivitamin with minerals Tabs tablet Take 1 tablet by mouth daily.   omeprazole 40 MG capsule Commonly known as: PRILOSEC Take 40 mg by mouth daily.   ondansetron 4 MG disintegrating tablet Commonly known as: Zofran ODT Take 1 tablet (4 mg total) by mouth every 8 (eight) hours as needed.   potassium chloride 10 MEQ tablet Commonly known as: KLOR-CON Take 10 mEq by mouth daily as needed (when taking furosemide.).   Spiriva HandiHaler 18 MCG inhalation capsule Generic drug: tiotropium Place 1 capsule into inhaler and inhale daily.   traZODone 100 MG tablet Commonly known as: DESYREL Take 100 mg by mouth at bedtime.   Ubrelvy 50 MG Tabs Generic drug: Ubrogepant Take 50 mg by mouth daily as needed (migraine).   verapamil 80 MG tablet Commonly known as: CALAN Take 80 mg by mouth 2 (two) times daily.        Allergies:  Allergies  Allergen Reactions   Gadolinium Nausea And Vomiting   Sulfa Antibiotics Hives and Rash    Red and swollen ears   Codeine Nausea And Vomiting   Iodinated Contrast Media Nausea And Vomiting    Nausea and vomiting after IV contast dye approximately 35 years ago.   Poison Ivy Extract Rash    Severe rash   Tramadol Nausea And Vomiting    Migraine     Family History: Family History  Problem Relation Age of Onset   Breast cancer Maternal Grandmother 35   Arthritis Maternal Grandmother    Asthma Maternal Grandmother    Depression Maternal Grandmother    Heart attack Maternal Grandmother    Arthritis Mother    Asthma Mother    COPD Mother    High Cholesterol  Mother    Hypertension Mother    Stroke Mother    Heart disease Father    Hypertension Father    Alcohol abuse Sister    COPD Sister    Alcohol abuse Brother    Depression Brother    Drug abuse Brother     Social History:  reports that she has quit smoking. She has never used smokeless tobacco. She reports that she does not currently use drugs after having used the following drugs: Cocaine. She reports that she does not drink alcohol.   Physical Exam: BP (!) 150/77    Pulse 65    Ht 5\' 3"  (1.6 m)    Wt 127 lb (57.6 kg)    LMP 03/17/2005 (Approximate)    BMI 22.50 kg/m   Constitutional:  Alert and oriented, No acute distress. HEENT: [place] AT, moist mucus membranes.  Trachea midline, no masses. Psychiatric: Normal mood and affect.   Pertinent Imaging: Images from a  KUB performed today were personally reviewed and interpreted.  There is a large amount of stool and bowel gas obscuring the renal outlines.  No definite calcifications suspicious for urinary tract stones are identified   Assessment & Plan:    1.  Recurrent nephrolithiasis Doing well No evidence of recurrent stone Continue general stone prevention guidelines 1 year follow-up with Lena, MD  [facility] [address], [place], [place] [address] [phone]

## 2021-07-29 ENCOUNTER — Ambulatory Visit: Payer: Self-pay | Admitting: Urology

## 2021-08-24 ENCOUNTER — Ambulatory Visit
Admission: RE | Admit: 2021-08-24 | Discharge: 2021-08-24 | Disposition: A | Discharge status: Home / Self Care | Payer: PPO | Source: Ambulatory Visit | Attending: Internal Medicine | Admitting: Internal Medicine

## 2021-08-24 ENCOUNTER — Other Ambulatory Visit: Payer: Self-pay

## 2021-08-24 DIAGNOSIS — Z1231 Encounter for screening mammogram for malignant neoplasm of breast: Secondary | ICD-10-CM | POA: Insufficient documentation

## 2021-10-25 DIAGNOSIS — Z79899 Other long term (current) drug therapy: Secondary | ICD-10-CM | POA: Diagnosis not present

## 2021-10-25 DIAGNOSIS — Z1211 Encounter for screening for malignant neoplasm of colon: Secondary | ICD-10-CM | POA: Diagnosis not present

## 2021-10-25 DIAGNOSIS — E559 Vitamin D deficiency, unspecified: Secondary | ICD-10-CM | POA: Diagnosis not present

## 2021-10-25 DIAGNOSIS — F902 Attention-deficit hyperactivity disorder, combined type: Secondary | ICD-10-CM | POA: Diagnosis not present

## 2021-10-25 DIAGNOSIS — J431 Panlobular emphysema: Secondary | ICD-10-CM | POA: Diagnosis not present

## 2021-10-25 DIAGNOSIS — Z0000 Encounter for general adult medical examination without abnormal findings: Secondary | ICD-10-CM | POA: Diagnosis not present

## 2021-10-25 DIAGNOSIS — M542 Cervicalgia: Secondary | ICD-10-CM | POA: Diagnosis not present

## 2021-10-25 DIAGNOSIS — M25511 Pain in right shoulder: Secondary | ICD-10-CM | POA: Diagnosis not present

## 2021-10-25 DIAGNOSIS — E162 Hypoglycemia, unspecified: Secondary | ICD-10-CM | POA: Diagnosis not present

## 2021-10-25 DIAGNOSIS — M4312 Spondylolisthesis, cervical region: Secondary | ICD-10-CM | POA: Diagnosis not present

## 2021-10-25 DIAGNOSIS — G43109 Migraine with aura, not intractable, without status migrainosus: Secondary | ICD-10-CM | POA: Diagnosis not present

## 2021-11-18 DIAGNOSIS — J431 Panlobular emphysema: Secondary | ICD-10-CM | POA: Diagnosis not present

## 2021-11-18 DIAGNOSIS — F902 Attention-deficit hyperactivity disorder, combined type: Secondary | ICD-10-CM | POA: Diagnosis not present

## 2021-11-18 DIAGNOSIS — E559 Vitamin D deficiency, unspecified: Secondary | ICD-10-CM | POA: Diagnosis not present

## 2021-11-18 DIAGNOSIS — E162 Hypoglycemia, unspecified: Secondary | ICD-10-CM | POA: Diagnosis not present

## 2021-11-18 DIAGNOSIS — G4733 Obstructive sleep apnea (adult) (pediatric): Secondary | ICD-10-CM | POA: Diagnosis not present

## 2021-12-29 IMAGING — CT CT RENAL STONE PROTOCOL
2 of 4 series · 16 of 46 positions shown, 18 images · non-contrast
Comparison: None.

CLINICAL DATA: Right-sided flank pain

EXAM:
CT ABDOMEN AND PELVIS WITHOUT CONTRAST
TECHNIQUE: Multidetector CT imaging of the abdomen and pelvis was performed
following the standard protocol without IV contrast.

[Series 2: stone full standard · axial · 0.73mm/px · z∈[-813,-428]mm · 13 of 85 slices shown, 15 images]
[im 4/85  soft-tissue]
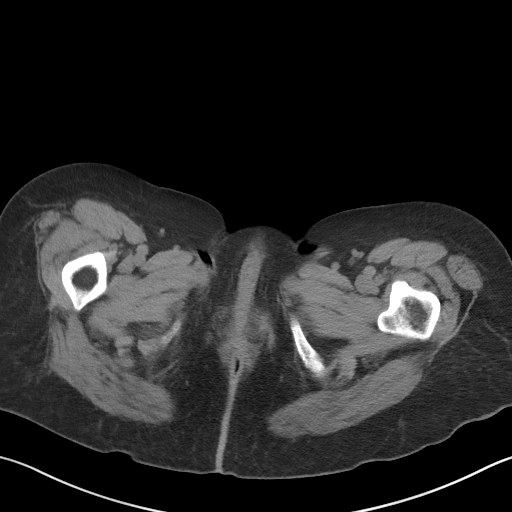
[im 4/85  bone]
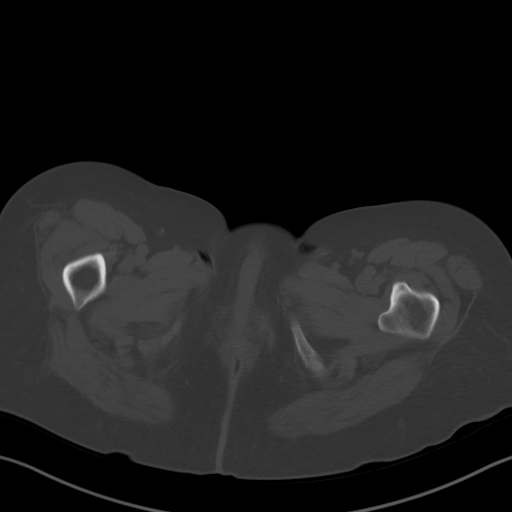
[im 11/85  soft-tissue]
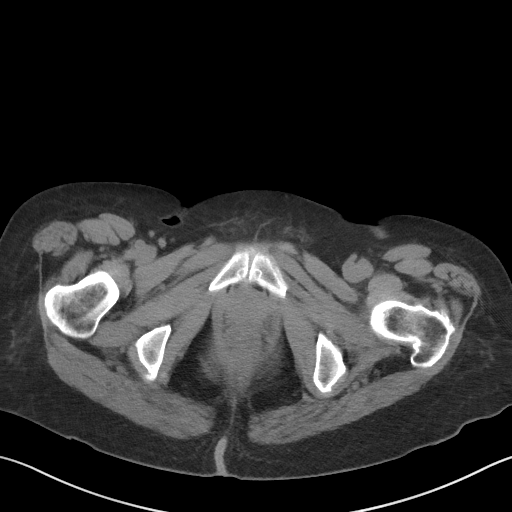
[im 18/85  soft-tissue]
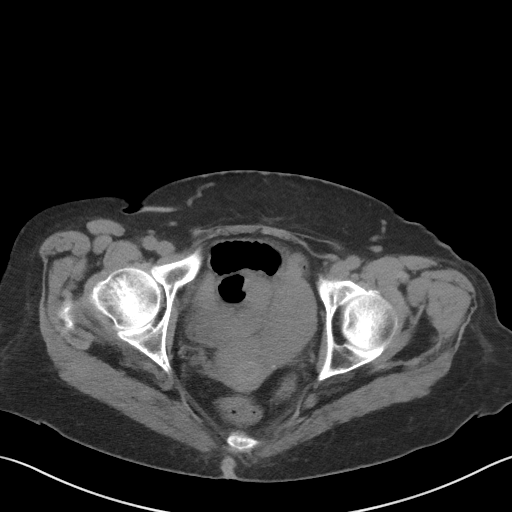
[im 25/85  soft-tissue]
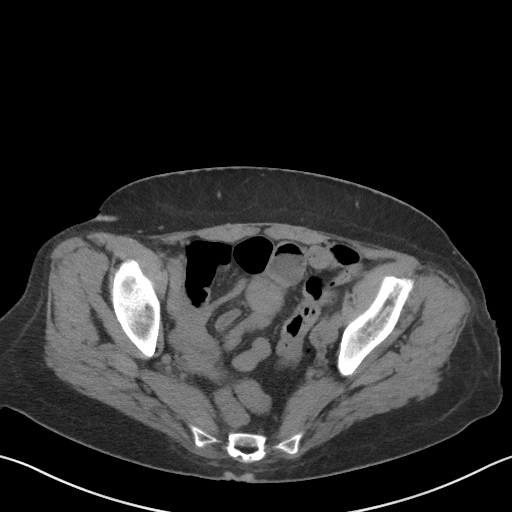
[im 29/85  soft-tissue]
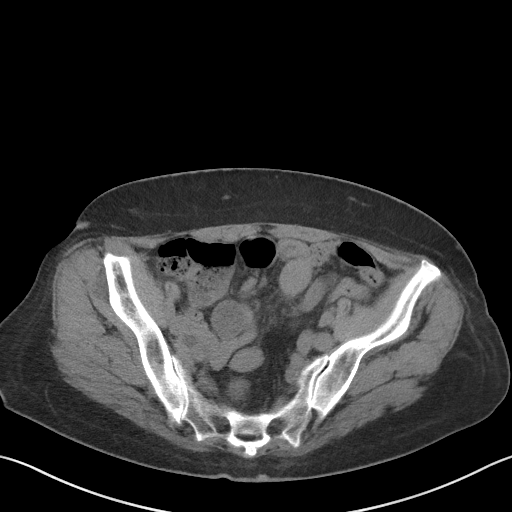
[im 36/85  soft-tissue]
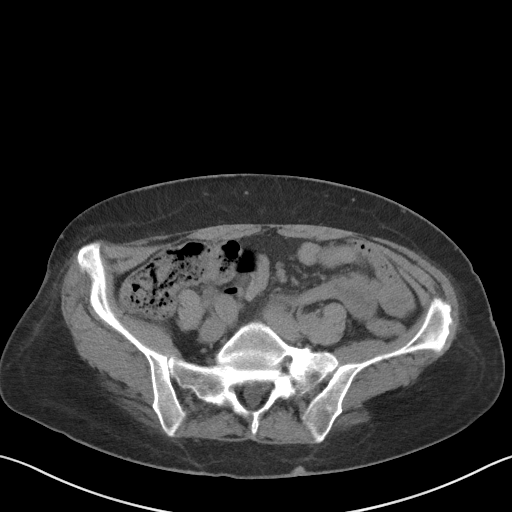
[im 43/85  soft-tissue]
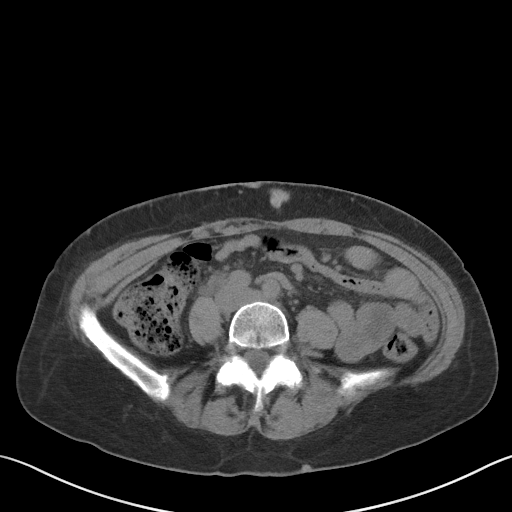
[im 50/85  soft-tissue]
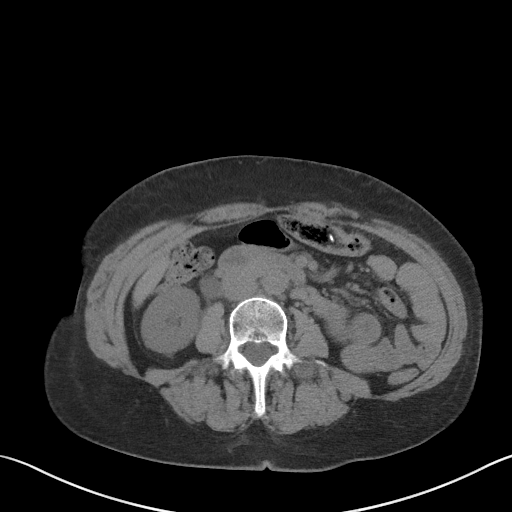
[im 57/85  soft-tissue]
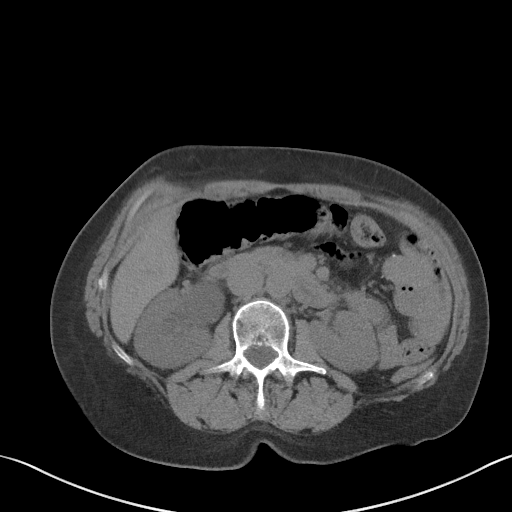
[im 57/85  bone]
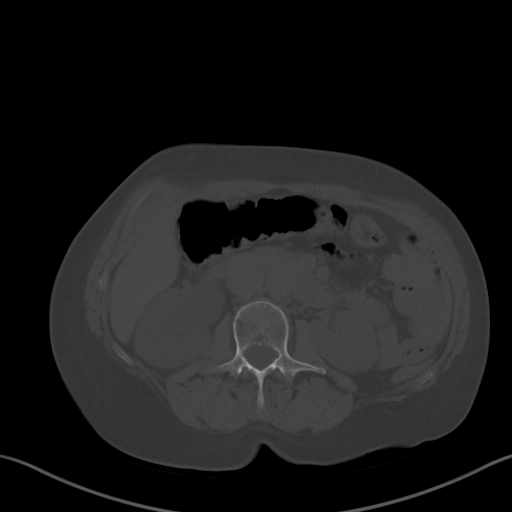
[im 60/85  soft-tissue]
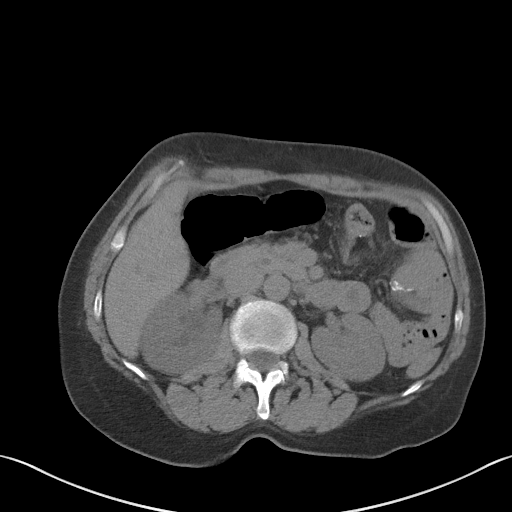
[im 67/85  soft-tissue]
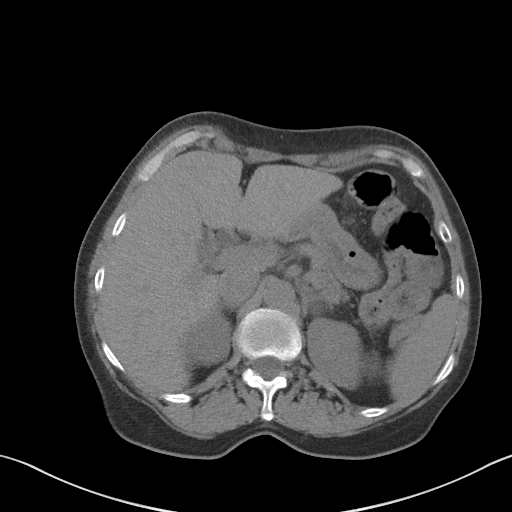
[im 74/85  soft-tissue]
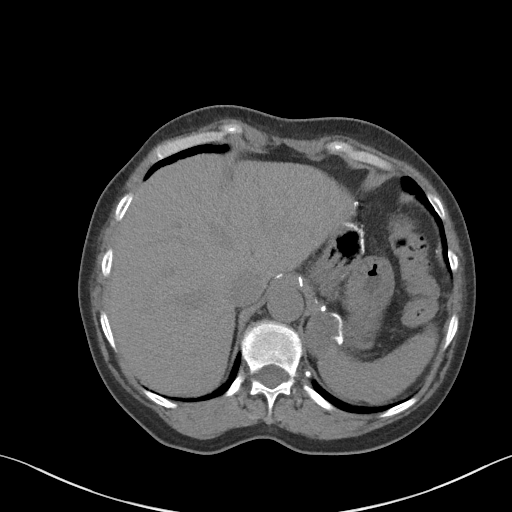
[im 81/85  soft-tissue]
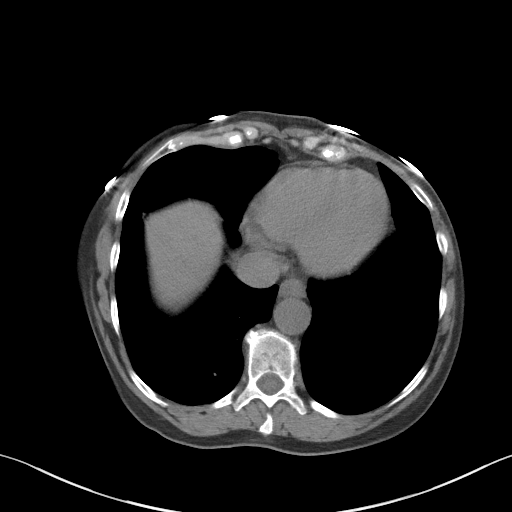

[Series 5: coronal · coronal · 0.67mm/px · 3 of 132 slices shown]
[im 44/132  soft-tissue]
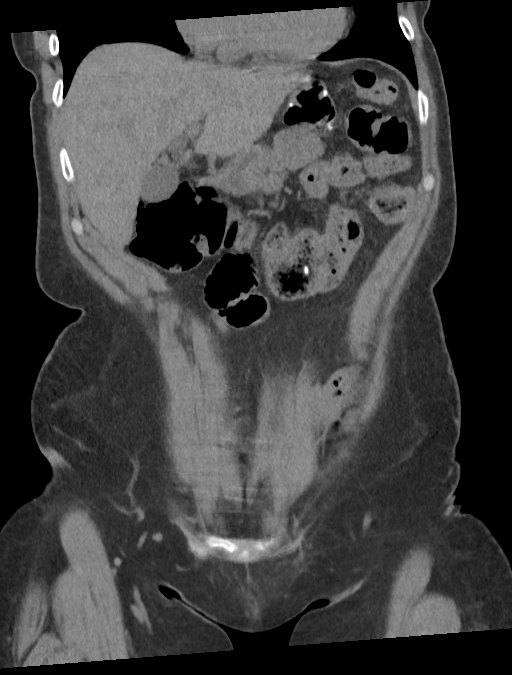
[im 59/132  soft-tissue]
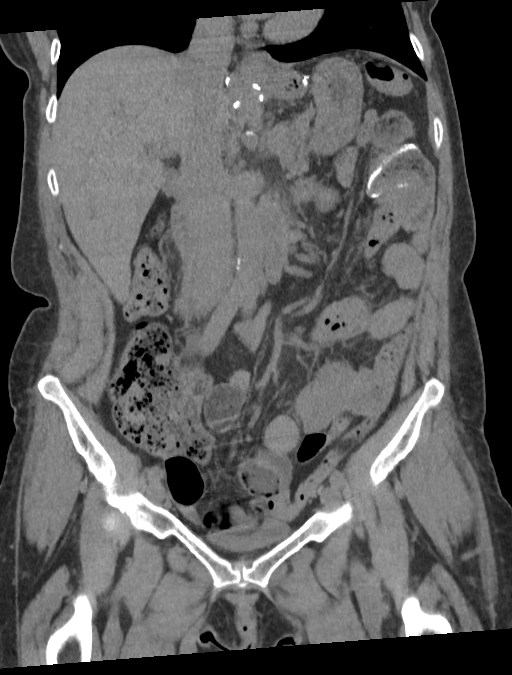
[im 73/132  soft-tissue]
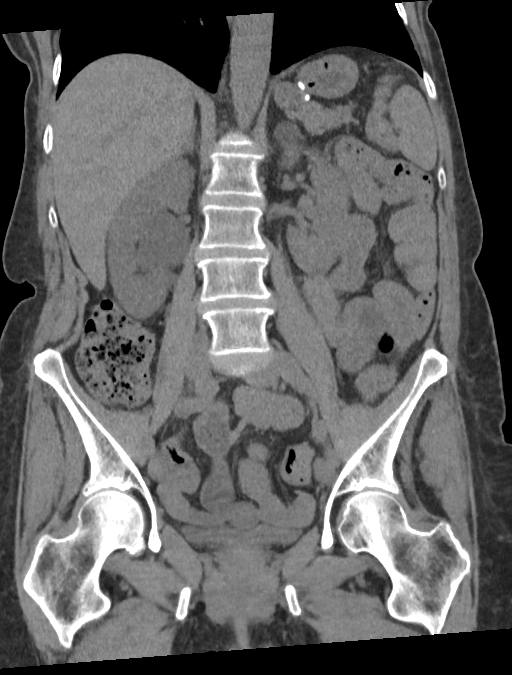

[16 of 46 positions shown; findings below may reference images not displayed]

FINDINGS: Lower chest: The visualized heart size within normal limits. No
pericardial fluid/thickening.

No hiatal hernia.

The visualized portions of the lungs are clear.

Hepatobiliary: Although limited due to the lack of intravenous
contrast, normal in appearance without gross focal abnormality. No
evidence of calcified gallstones or biliary ductal dilatation.

Pancreas:  Unremarkable.  No surrounding inflammatory changes.

Spleen: Normal in size. Although limited due to the lack of
intravenous contrast, normal in appearance.

Adrenals/Urinary Tract: Both adrenal glands appear normal. Mild to
moderate right pelvicaliectasis and ureterectasis is seen to the
proximal ureter where there is a 4 mm calculus present. Mild
right-sided perinephric stranding is seen. No left-sided renal or
collecting system calculi are noted. The bladder is partially
decompressed and there is question of a punctate calcification in
the posterior right bladder.

Stomach/Bowel: The stomach, small bowel, and colon are normal in
appearance. No inflammatory changes or obstructive findings.
appendix is normal.

Vascular/Lymphatic: There are no enlarged abdominal or pelvic lymph
nodes. Scattered aortic atherosclerotic calcifications are seen
without aneurysmal dilatation.

Reproductive: The uterus and adnexa are unremarkable.

Other: No evidence of abdominal wall mass or hernia.

Musculoskeletal: No acute or significant osseous findings.
IMPRESSION: Mild to moderate right hydronephrosis to the mid right ureter where
there is a 4 mm calculus present.

Probable punctate calcification a posterior right bladder.

Aortic Atherosclerosis ([id number]).

## 2022-01-24 DIAGNOSIS — E162 Hypoglycemia, unspecified: Secondary | ICD-10-CM | POA: Diagnosis not present

## 2022-01-24 DIAGNOSIS — Z0000 Encounter for general adult medical examination without abnormal findings: Secondary | ICD-10-CM | POA: Diagnosis not present

## 2022-01-24 DIAGNOSIS — Z9884 Bariatric surgery status: Secondary | ICD-10-CM | POA: Diagnosis not present

## 2022-01-24 DIAGNOSIS — E559 Vitamin D deficiency, unspecified: Secondary | ICD-10-CM | POA: Diagnosis not present

## 2022-01-24 DIAGNOSIS — J431 Panlobular emphysema: Secondary | ICD-10-CM | POA: Diagnosis not present

## 2022-01-24 DIAGNOSIS — F902 Attention-deficit hyperactivity disorder, combined type: Secondary | ICD-10-CM | POA: Diagnosis not present

## 2022-01-24 DIAGNOSIS — K219 Gastro-esophageal reflux disease without esophagitis: Secondary | ICD-10-CM | POA: Diagnosis not present

## 2022-01-24 DIAGNOSIS — G43109 Migraine with aura, not intractable, without status migrainosus: Secondary | ICD-10-CM | POA: Diagnosis not present

## 2022-01-26 DIAGNOSIS — H25011 Cortical age-related cataract, right eye: Secondary | ICD-10-CM | POA: Diagnosis not present

## 2022-03-15 DIAGNOSIS — M7062 Trochanteric bursitis, left hip: Secondary | ICD-10-CM | POA: Diagnosis not present

## 2022-03-17 DIAGNOSIS — G8929 Other chronic pain: Secondary | ICD-10-CM | POA: Diagnosis not present

## 2022-03-17 DIAGNOSIS — R42 Dizziness and giddiness: Secondary | ICD-10-CM | POA: Diagnosis not present

## 2022-03-17 DIAGNOSIS — H539 Unspecified visual disturbance: Secondary | ICD-10-CM | POA: Diagnosis not present

## 2022-03-17 DIAGNOSIS — R519 Headache, unspecified: Secondary | ICD-10-CM | POA: Diagnosis not present

## 2022-03-17 DIAGNOSIS — G4733 Obstructive sleep apnea (adult) (pediatric): Secondary | ICD-10-CM | POA: Diagnosis not present

## 2022-03-17 DIAGNOSIS — Z6824 Body mass index (BMI) 24.0-24.9, adult: Secondary | ICD-10-CM | POA: Diagnosis not present

## 2022-03-28 DIAGNOSIS — M5481 Occipital neuralgia: Secondary | ICD-10-CM | POA: Diagnosis not present

## 2022-04-11 DIAGNOSIS — G43009 Migraine without aura, not intractable, without status migrainosus: Secondary | ICD-10-CM | POA: Diagnosis not present

## 2022-04-13 DIAGNOSIS — R519 Headache, unspecified: Secondary | ICD-10-CM | POA: Diagnosis not present

## 2022-04-13 DIAGNOSIS — M461 Sacroiliitis, not elsewhere classified: Secondary | ICD-10-CM | POA: Diagnosis not present

## 2022-04-14 ENCOUNTER — Other Ambulatory Visit: Payer: Self-pay | Admitting: Family Medicine

## 2022-04-14 DIAGNOSIS — R519 Headache, unspecified: Secondary | ICD-10-CM

## 2022-04-18 DIAGNOSIS — G8929 Other chronic pain: Secondary | ICD-10-CM | POA: Diagnosis not present

## 2022-04-18 DIAGNOSIS — G4733 Obstructive sleep apnea (adult) (pediatric): Secondary | ICD-10-CM | POA: Diagnosis not present

## 2022-04-18 DIAGNOSIS — R519 Headache, unspecified: Secondary | ICD-10-CM | POA: Diagnosis not present

## 2022-04-18 DIAGNOSIS — H539 Unspecified visual disturbance: Secondary | ICD-10-CM | POA: Diagnosis not present

## 2022-04-18 DIAGNOSIS — R42 Dizziness and giddiness: Secondary | ICD-10-CM | POA: Diagnosis not present

## 2022-04-25 DIAGNOSIS — G8929 Other chronic pain: Secondary | ICD-10-CM | POA: Diagnosis not present

## 2022-04-25 DIAGNOSIS — K148 Other diseases of tongue: Secondary | ICD-10-CM | POA: Diagnosis not present

## 2022-04-25 DIAGNOSIS — R519 Headache, unspecified: Secondary | ICD-10-CM | POA: Diagnosis not present

## 2022-05-02 DIAGNOSIS — I6782 Cerebral ischemia: Secondary | ICD-10-CM | POA: Diagnosis not present

## 2022-05-02 DIAGNOSIS — R9082 White matter disease, unspecified: Secondary | ICD-10-CM | POA: Diagnosis not present

## 2022-05-02 DIAGNOSIS — G319 Degenerative disease of nervous system, unspecified: Secondary | ICD-10-CM | POA: Diagnosis not present

## 2022-05-02 DIAGNOSIS — R519 Headache, unspecified: Secondary | ICD-10-CM | POA: Diagnosis not present

## 2022-05-02 DIAGNOSIS — G8929 Other chronic pain: Secondary | ICD-10-CM | POA: Diagnosis not present

## 2022-06-30 ENCOUNTER — Ambulatory Visit
Admission: EM | Admit: 2022-06-30 | Discharge: 2022-06-30 | Disposition: A | Discharge status: Home / Self Care | Payer: PPO | Attending: Emergency Medicine | Admitting: Emergency Medicine

## 2022-06-30 DIAGNOSIS — R051 Acute cough: Secondary | ICD-10-CM | POA: Insufficient documentation

## 2022-06-30 DIAGNOSIS — J449 Chronic obstructive pulmonary disease, unspecified: Secondary | ICD-10-CM | POA: Diagnosis not present

## 2022-06-30 DIAGNOSIS — Z8616 Personal history of COVID-19: Secondary | ICD-10-CM | POA: Insufficient documentation

## 2022-06-30 DIAGNOSIS — Z87891 Personal history of nicotine dependence: Secondary | ICD-10-CM | POA: Insufficient documentation

## 2022-06-30 DIAGNOSIS — J0100 Acute maxillary sinusitis, unspecified: Secondary | ICD-10-CM | POA: Diagnosis not present

## 2022-06-30 DIAGNOSIS — R059 Cough, unspecified: Secondary | ICD-10-CM | POA: Diagnosis present

## 2022-06-30 DIAGNOSIS — Z1152 Encounter for screening for COVID-19: Secondary | ICD-10-CM | POA: Diagnosis not present

## 2022-06-30 DIAGNOSIS — Z9884 Bariatric surgery status: Secondary | ICD-10-CM | POA: Diagnosis not present

## 2022-06-30 DIAGNOSIS — G4733 Obstructive sleep apnea (adult) (pediatric): Secondary | ICD-10-CM | POA: Insufficient documentation

## 2022-06-30 MED ORDER — AZITHROMYCIN 250 MG PO TABS: 250.0000 mg | ORAL_TABLET | Freq: Every day | ORAL | 0 refills | Status: DC

## 2022-06-30 NOTE — Discharge Instructions (Addendum)
Take the Zithromax as directed.  Follow up with your primary care provider if your symptoms are not improving.    

## 2022-06-30 NOTE — ED Triage Notes (Signed)
Patient to Urgent Care with complaints of dry cough x2-3 weeks. Reports initially it was productive but is now dry.   States that today she started experiencing a lot of sinus congestion and generally not feeling well. Denies any known fevers.  Reports that 3 days ago she started developing some pain in her left/ mid back when breathing or turning.   Has been taking mucinex/ mucinex-DM.

## 2022-06-30 NOTE — ED Provider Notes (Signed)
Renaldo Fiddler    CSN: [id number] Arrival date & time: 06/30/22  1617      History   Chief Complaint Chief Complaint  Patient presents with   URI    HPI Valerie Ford is a 66 y.o. female.  Patient presents with 3 week history of congestion and cough.  She reports back pain from coughing.  No falls or injury.  No fever, wheezing, shortness of breath, chest pain, or other symptoms.  Treatment at home with Mucinex.  She has not needed to use her albuterol inhaler.  Her medical history includes COPD, allergies, obstructive sleep apnea, migraine headache, s/p bariatric surgery.   The history is provided by the patient and medical records.    Past Medical History:  Diagnosis Date   ADHD 01/08/2018   COPD (chronic obstructive pulmonary disease) (HCC)    COVID-19    Fatty liver 01/07/2018   GERD (gastroesophageal reflux disease) 01/07/2018   History of bariatric surgery 01/07/2018   History of cocaine use, remote, > 20 years 01/11/2018   History of kidney stones    Migraine with aura and without status migrainosus, not intractable 01/11/2018   Obstructive sleep apnea syndrome 10/20/2016   Osteoarthritis of right knee 01/07/2018   Sacroiliitis (HCC) 01/07/2018   Seasonal allergic rhinitis due to pollen 01/11/2018   Vitamin D deficiency 01/11/2018    Patient Active Problem List   Diagnosis Date Noted   Cough 06/30/2022   Sacroiliac joint pain 06/11/2020   Pain of left hip joint 06/10/2020   Lumbar radiculopathy 06/10/2020   Panlobular emphysema (HCC) 05/27/2020   Hypoglycemia 05/27/2020   COPD (chronic obstructive pulmonary disease) (HCC) 05/04/2020   Migraine with typical aura 01/11/2018   History of cocaine use, remote, > 20 years 01/11/2018   Seasonal allergic rhinitis due to pollen 01/11/2018   Vitamin D deficiency 01/11/2018   Obesity (BMI 30-39.9), s/p bariatric surgery 01/11/2018   ADHD, followed by Psych, on low dose Adderall 01/08/2018   Fatty liver 01/07/2018   Status  post bariatric surgery 01/07/2018   GERD (gastroesophageal reflux disease) 01/07/2018   Osteoarthritis of right knee 01/07/2018   Sacroiliitis (HCC) 01/07/2018   Obstructive sleep apnea syndrome, not on CPAP since weight loss 10/20/2016    Past Surgical History:  Procedure Laterality Date   CESAREAN SECTION     CYSTOSCOPY/URETEROSCOPY/HOLMIUM LASER/STENT PLACEMENT Left 05/15/2018   Procedure: CYSTOSCOPY/URETEROSCOPY/HOLMIUM LASER/STONE REMOVAL/STENT PLACEMENT;  Surgeon: Riki Altes, MD;  Location: [facility] ORS;  Service: Urology;  Laterality: Left;   CYSTOSCOPY/URETEROSCOPY/HOLMIUM LASER/STENT PLACEMENT Right 08/18/2020   Procedure: CYSTOSCOPY/URETEROSCOPY/HOLMIUM LASER/STONE REMOVAL/STENT PLACEMENT;  Surgeon: Riki Altes, MD;  Location: [facility] ORS;  Service: Urology;  Laterality: Right;   ESOPHAGOGASTRODUODENOSCOPY N/A 11/16/2020   Procedure: ESOPHAGOGASTRODUODENOSCOPY (EGD);  Surgeon: Regis Bill, MD;  Location[facility];  Service: Endoscopy;  Laterality: N/A;   GASTRIC ROUX-EN-Y     NASAL SINUS SURGERY      OB History   No obstetric history on file.      Home Medications    Prior to Admission medications   Medication Sig Start Date End Date Taking? Authorizing Provider  azithromycin (ZITHROMAX) 250 MG tablet Take 1 tablet (250 mg total) by mouth daily. Take first 2 tablets together, then 1 every day until finished. 06/30/22  Yes Mickie Bail, NP  Galcanezumab-gnlm 120 MG/ML SOAJ Inject into the skin. 04/18/22  Yes [provider]  acetaminophen (TYLENOL) 500 MG tablet Take 1,000 mg by mouth every 6 (six) hours  as needed for moderate pain or headache.    [provider]  albuterol (VENTOLIN HFA) 108 (90 Base) MCG/ACT inhaler Inhale 2 puffs into the lungs every 6 (six) hours as needed for wheezing or shortness of breath. 08/03/20   [provider]  amphetamine-dextroamphetamine (ADDERALL) 10 MG tablet Take 10 mg by mouth 3 (three) times daily.  01/05/18   [provider]  baclofen (LIORESAL) 10 MG tablet Take 10 mg by mouth at bedtime. 04/11/20   [provider]  celecoxib (CELEBREX) 200 MG capsule Take 200 mg by mouth daily.  01/31/17   [provider]  cholecalciferol (VITAMIN D) 25 MCG (1000 UNIT) tablet Take 1,000 Units by mouth daily.    [provider]  COLLAGEN PO Take 1 Scoop by mouth daily.    [provider]  FLUoxetine (PROZAC) 20 MG capsule Take 20 mg by mouth daily. 04/24/19   [provider]  fluticasone (FLONASE) 50 MCG/ACT nasal spray Place 1 spray into both nostrils daily as needed for allergies or rhinitis.    [provider]  furosemide (LASIX) 20 MG tablet Take 20 mg by mouth daily as needed (swelling).     [provider]  Multiple Vitamin (MULTIVITAMIN WITH MINERALS) TABS tablet Take 1 tablet by mouth daily.    [provider]  omeprazole (PRILOSEC) 40 MG capsule Take 40 mg by mouth daily. 05/27/20   [provider]  ondansetron (ZOFRAN ODT) 4 MG disintegrating tablet Take 1 tablet (4 mg total) by mouth every 8 (eight) hours as needed. Patient not taking: Reported on 07/28/2021 07/12/20   Nita SickleVeronese, [place], MD  potassium chloride (K-DUR) 10 MEQ tablet Take 10 mEq by mouth daily as needed (when taking furosemide.).    [provider]  SPIRIVA HANDIHALER 18 MCG inhalation capsule Place 1 capsule into inhaler and inhale daily. 03/27/20   [provider]  traZODone (DESYREL) 100 MG tablet Take 100 mg by mouth at bedtime. 04/06/20   [provider]  Ubrogepant (UBRELVY) 50 MG TABS Take 50 mg by mouth daily as needed (migraine).    [provider]  verapamil (CALAN) 80 MG tablet Take 80 mg by mouth 2 (two) times daily. 05/27/20   [provider]    Family History Family History  Problem Relation Age of Onset   Breast cancer Maternal Grandmother 2155   Arthritis Maternal Grandmother    Asthma  Maternal Grandmother    Depression Maternal Grandmother    Heart attack Maternal Grandmother    Arthritis Mother    Asthma Mother    COPD Mother    High Cholesterol Mother    Hypertension Mother    Stroke Mother    Heart disease Father    Hypertension Father    Alcohol abuse Sister    COPD Sister    Alcohol abuse Brother    Depression Brother    Drug abuse Brother     Social History Social History   Tobacco Use   Smoking status: Former   Smokeless tobacco: Never  Building services engineerVaping Use   Vaping Use: Never used  Substance Use Topics   Alcohol use: No   Drug use: Not Currently    Types: Cocaine     Allergies   Gadolinium, Sulfa antibiotics, Codeine, Iodinated contrast media, Poison ivy extract, and Tramadol   Review of Systems Review of Systems  Constitutional:  Negative for chills and fever.  HENT:  Positive for congestion. Negative for ear pain and sore throat.  Respiratory:  Positive for cough. Negative for shortness of breath and wheezing.   Cardiovascular:  Negative for chest pain and palpitations.  Gastrointestinal:  Negative for diarrhea and vomiting.  Skin:  Negative for color change and rash.  All other systems reviewed and are negative.    Physical Exam Triage Vital Signs ED Triage Vitals  Enc Vitals Group     BP 06/30/22 1709 (!) 143/77     Pulse Rate 06/30/22 1702 62     Resp 06/30/22 1702 18     Temp 06/30/22 1702 98.1 F (36.7 C)     Temp src --      SpO2 06/30/22 1702 97 %     Weight 06/30/22 1705 130 lb (59 kg)     Height 06/30/22 1705 5\' 3"  (1.6 m)     Head Circumference --      Peak Flow --      Pain Score 06/30/22 1701 7     Pain Loc --      Pain Edu? --      Excl. in [place]? --    No data found.  Updated Vital Signs BP (!) 143/77   Pulse 62   Temp 98.1 F (36.7 C)   Resp 18   Ht 5\' 3"  (1.6 m)   Wt 130 lb (59 kg)   LMP 03/17/2005 (Approximate)   SpO2 97%   BMI 23.03 kg/m   Visual Acuity Right Eye Distance:   Left Eye Distance:    Bilateral Distance:    Right Eye Near:   Left Eye Near:    Bilateral Near:     Physical Exam Vitals and nursing note reviewed.  Constitutional:      General: She is not in acute distress.    Appearance: Normal appearance. She is well-developed. She is not ill-appearing.  HENT:     Right Ear: Tympanic membrane normal.     Left Ear: Tympanic membrane normal.     Nose: Nose normal.     Mouth/Throat:     Mouth: Mucous membranes are moist.     Pharynx: Oropharynx is clear.  Cardiovascular:     Rate and Rhythm: Normal rate and regular rhythm.     Heart sounds: Normal heart sounds.  Pulmonary:     Effort: Pulmonary effort is normal. No respiratory distress.     Breath sounds: Normal breath sounds. No wheezing, rhonchi or rales.  Musculoskeletal:     Cervical back: Neck supple.  Skin:    General: Skin is warm and dry.  Neurological:     Mental Status: She is alert.  Psychiatric:        Mood and Affect: Mood normal.        Behavior: Behavior normal.      UC Treatments / Results  Labs (all labs ordered are listed, but only abnormal results are displayed) Labs Reviewed  SARS CORONAVIRUS 2 (TAT 6-24 HRS)    EKG   Radiology No results found.  Procedures Procedures (including critical care time)  Medications Ordered in UC Medications - No data to display  Initial Impression / Assessment and Plan / UC Course  I have reviewed the triage vital signs and the nursing notes.  Pertinent labs & imaging results that were available during my care of the patient were reviewed by me and considered in my medical decision making (see chart for details).   Cough, acute sinusitis.  No wheezing or shortness of breath.  O2 sat 97% on room air.  Treating with Zithromax as patient reports this has worked well for her in the past.  She declines prednisone and given her history of bariatric surgery, I agree with this decision at this time.  Instructed her to follow-up with her PCP if her  symptoms or not improving.  Education provided on sinus infection.  Patient agrees to plan of care.   Final Clinical Impressions(s) / UC Diagnoses   Final diagnoses:  Acute cough  Acute non-recurrent maxillary sinusitis     Discharge Instructions      Take the Zithromax as directed.  Follow up with your primary care provider if your symptoms are not improving.        ED Prescriptions     Medication Sig Dispense Auth. Provider   azithromycin (ZITHROMAX) 250 MG tablet Take 1 tablet (250 mg total) by mouth daily. Take first 2 tablets together, then 1 every day until finished. 6 tablet Mickie Bail, NP      PDMP not reviewed this encounter.   Mickie Bail, NP 06/30/22 1733

## 2022-07-01 LAB — SARS CORONAVIRUS 2 (TAT 6-24 HRS): SARS Coronavirus 2: NEGATIVE

## 2022-07-13 DIAGNOSIS — M7062 Trochanteric bursitis, left hip: Secondary | ICD-10-CM | POA: Diagnosis not present

## 2022-07-15 DIAGNOSIS — M533 Sacrococcygeal disorders, not elsewhere classified: Secondary | ICD-10-CM | POA: Diagnosis not present

## 2022-07-20 DIAGNOSIS — Z1231 Encounter for screening mammogram for malignant neoplasm of breast: Secondary | ICD-10-CM | POA: Diagnosis not present

## 2022-07-20 DIAGNOSIS — F909 Attention-deficit hyperactivity disorder, unspecified type: Secondary | ICD-10-CM | POA: Diagnosis not present

## 2022-07-20 DIAGNOSIS — J431 Panlobular emphysema: Secondary | ICD-10-CM | POA: Diagnosis not present

## 2022-07-20 DIAGNOSIS — R5381 Other malaise: Secondary | ICD-10-CM | POA: Diagnosis not present

## 2022-07-20 DIAGNOSIS — R0789 Other chest pain: Secondary | ICD-10-CM | POA: Diagnosis not present

## 2022-07-20 DIAGNOSIS — Z9884 Bariatric surgery status: Secondary | ICD-10-CM | POA: Diagnosis not present

## 2022-07-20 DIAGNOSIS — E559 Vitamin D deficiency, unspecified: Secondary | ICD-10-CM | POA: Diagnosis not present

## 2022-07-20 DIAGNOSIS — Z79899 Other long term (current) drug therapy: Secondary | ICD-10-CM | POA: Diagnosis not present

## 2022-07-20 DIAGNOSIS — E162 Hypoglycemia, unspecified: Secondary | ICD-10-CM | POA: Diagnosis not present

## 2022-07-20 DIAGNOSIS — R5383 Other fatigue: Secondary | ICD-10-CM | POA: Diagnosis not present

## 2022-07-20 DIAGNOSIS — J449 Chronic obstructive pulmonary disease, unspecified: Secondary | ICD-10-CM | POA: Diagnosis not present

## 2022-07-21 ENCOUNTER — Other Ambulatory Visit: Payer: Self-pay | Admitting: Internal Medicine

## 2022-07-21 DIAGNOSIS — Z1231 Encounter for screening mammogram for malignant neoplasm of breast: Secondary | ICD-10-CM

## 2022-07-22 DIAGNOSIS — G43109 Migraine with aura, not intractable, without status migrainosus: Secondary | ICD-10-CM | POA: Diagnosis not present

## 2022-07-22 DIAGNOSIS — H25013 Cortical age-related cataract, bilateral: Secondary | ICD-10-CM | POA: Diagnosis not present

## 2022-07-27 ENCOUNTER — Ambulatory Visit
Admission: RE | Admit: 2022-07-27 | Discharge: 2022-07-27 | Disposition: A | Discharge status: Home / Self Care | Payer: PPO | Source: Ambulatory Visit | Attending: Urology | Admitting: Urology

## 2022-07-27 ENCOUNTER — Encounter: Payer: Self-pay | Admitting: Urology

## 2022-07-27 ENCOUNTER — Ambulatory Visit: Payer: PPO | Admitting: Urology

## 2022-07-27 VITALS — BP 137/83 | HR 69 | Ht 63.0 in | Wt 135.0 lb

## 2022-07-27 DIAGNOSIS — R109 Unspecified abdominal pain: Secondary | ICD-10-CM | POA: Diagnosis not present

## 2022-07-27 DIAGNOSIS — N200 Calculus of kidney: Secondary | ICD-10-CM

## 2022-07-27 DIAGNOSIS — K5900 Constipation, unspecified: Secondary | ICD-10-CM | POA: Diagnosis not present

## 2022-07-27 NOTE — Progress Notes (Signed)
07/27/2022 1:12 PM   Valerie Ford 1955-10-27 [id number]  Referring provider: Idelle Crouch, MD [facility] [place],  [place] [address]  Chief Complaint  Patient presents with   Nephrolithiasis    Urologic history: 1. Recurrent stone disease -Ureteroscopic removal left mid ureteral calculus 05/2018 -Stone analysis CaOxMono/CaOxDi/CaPhos 20/70/10 -Passed stone 20+ years prior -Ureteroscopic removal of a 4 mm right proximal ureteral calculus 08/2020 -CT showed no additional renal calculi -Stone analysis CaOxMono/CaOxDi 10/90  HPI: 67 y.o. female presents for follow-up  Doing well since last visit No bothersome LUTS; takes Azo on occasions for burning Denies dysuria, gross hematuria Denies flank, abdominal or pelvic pain  PMH: Past Medical History:  Diagnosis Date   ADHD 01/08/2018   COPD (chronic obstructive pulmonary disease) ([place])    COVID-19    Fatty liver 01/07/2018   GERD (gastroesophageal reflux disease) 01/07/2018   History of bariatric surgery 01/07/2018   History of cocaine use, remote, > 20 years 01/11/2018   History of kidney stones    Migraine with aura and without status migrainosus, not intractable 01/11/2018   Obstructive sleep apnea syndrome 10/20/2016   Osteoarthritis of right knee 01/07/2018   Sacroiliitis ([place]) 01/07/2018   Seasonal allergic rhinitis due to pollen 01/11/2018   Vitamin D deficiency 01/11/2018    Surgical History: Past Surgical History:  Procedure Laterality Date   CESAREAN SECTION     CYSTOSCOPY/URETEROSCOPY/HOLMIUM LASER/STENT PLACEMENT Left 05/15/2018   Procedure: CYSTOSCOPY/URETEROSCOPY/HOLMIUM LASER/STONE REMOVAL/STENT PLACEMENT;  Surgeon: Abbie Sons, MD;  Location: [facility] ORS;  Service: Urology;  Laterality: Left;   CYSTOSCOPY/URETEROSCOPY/HOLMIUM LASER/STENT PLACEMENT Right 08/18/2020   Procedure: CYSTOSCOPY/URETEROSCOPY/HOLMIUM LASER/STONE REMOVAL/STENT PLACEMENT;  Surgeon: Abbie Sons, MD;   Location: [facility] ORS;  Service: Urology;  Laterality: Right;   ESOPHAGOGASTRODUODENOSCOPY N/A 11/16/2020   Procedure: ESOPHAGOGASTRODUODENOSCOPY (EGD);  Surgeon: Lesly Rubenstein, MD;  Location: [facility] ENDOSCOPY;  Service: Endoscopy;  Laterality: N/A;   GASTRIC ROUX-EN-Y     NASAL SINUS SURGERY      Home Medications:  Allergies as of 07/27/2022       Reactions   Gadolinium Nausea And Vomiting   Sulfa Antibiotics Hives, Rash   Red and swollen ears   Codeine Nausea And Vomiting   Iodinated Contrast Media Nausea And Vomiting   Nausea and vomiting after IV contast dye approximately 35 years ago.   Poison Ivy Extract Rash   Severe rash   Tramadol Nausea And Vomiting   Migraine        Medication List        Accurate as of July 27, 2022  1:12 PM. If you have any questions, ask your nurse or doctor.          STOP taking these medications    azithromycin 250 MG tablet Commonly known as: ZITHROMAX Stopped by: Abbie Sons, MD       TAKE these medications    acetaminophen 500 MG tablet Commonly known as: TYLENOL Take 1,000 mg by mouth every 6 (six) hours as needed for moderate pain or headache.   albuterol 108 (90 Base) MCG/ACT inhaler Commonly known as: VENTOLIN HFA Inhale 2 puffs into the lungs every 6 (six) hours as needed for wheezing or shortness of breath.   amphetamine-dextroamphetamine 10 MG tablet Commonly known as: ADDERALL Take 10 mg by mouth 3 (three) times daily.   baclofen 10 MG tablet Commonly known as: LIORESAL Take 10 mg by mouth at bedtime.   celecoxib 200 MG capsule Commonly  known as: CELEBREX Take 200 mg by mouth daily.   cholecalciferol 25 MCG (1000 UNIT) tablet Commonly known as: VITAMIN D3 Take 1,000 Units by mouth daily.   COLLAGEN PO Take 1 Scoop by mouth daily.   FLUoxetine 20 MG capsule Commonly known as: PROZAC Take 20 mg by mouth daily.   fluticasone 50 MCG/ACT nasal spray Commonly known as: FLONASE Place 1 spray into  both nostrils daily as needed for allergies or rhinitis.   furosemide 20 MG tablet Commonly known as: LASIX Take 20 mg by mouth daily as needed (swelling).   Galcanezumab-gnlm 120 MG/ML Soaj Inject into the skin.   multivitamin with minerals Tabs tablet Take 1 tablet by mouth daily.   omeprazole 40 MG capsule Commonly known as: PRILOSEC Take 40 mg by mouth daily.   ondansetron 4 MG disintegrating tablet Commonly known as: Zofran ODT Take 1 tablet (4 mg total) by mouth every 8 (eight) hours as needed.   potassium chloride 10 MEQ tablet Commonly known as: KLOR-CON Take 10 mEq by mouth daily as needed (when taking furosemide.).   Spiriva HandiHaler 18 MCG inhalation capsule Generic drug: tiotropium Place 1 capsule into inhaler and inhale daily.   traZODone 100 MG tablet Commonly known as: DESYREL Take 100 mg by mouth at bedtime.   Ubrelvy 50 MG Tabs Generic drug: Ubrogepant Take 50 mg by mouth daily as needed (migraine).   verapamil 80 MG tablet Commonly known as: CALAN Take 80 mg by mouth 2 (two) times daily.        Allergies:  Allergies  Allergen Reactions   Gadolinium Nausea And Vomiting   Sulfa Antibiotics Hives and Rash    Red and swollen ears   Codeine Nausea And Vomiting   Iodinated Contrast Media Nausea And Vomiting    Nausea and vomiting after IV contast dye approximately 35 years ago.   Poison Ivy Extract Rash    Severe rash   Tramadol Nausea And Vomiting    Migraine     Family History: Family History  Problem Relation Age of Onset   Breast cancer Maternal Grandmother 57   Arthritis Maternal Grandmother    Asthma Maternal Grandmother    Depression Maternal Grandmother    Heart attack Maternal Grandmother    Arthritis Mother    Asthma Mother    COPD Mother    High Cholesterol Mother    Hypertension Mother    Stroke Mother    Heart disease Father    Hypertension Father    Alcohol abuse Sister    COPD Sister    Alcohol abuse Brother     Depression Brother    Drug abuse Brother     Social History:  reports that she has quit smoking. She has never used smokeless tobacco. She reports that she does not currently use drugs after having used the following drugs: Cocaine. She reports that she does not drink alcohol.   Physical Exam: BP 137/83   Pulse 69   Ht 5\' 3"  (1.6 m)   Wt 135 lb (61.2 kg)   LMP 03/17/2005 (Approximate)   BMI 23.91 kg/m   Constitutional:  Alert and oriented, No acute distress. HEENT: [place] AT, moist mucus membranes.  Trachea midline, no masses. Psychiatric: Normal mood and affect.   Pertinent Imaging: Images from a KUB performed today were personally reviewed and interpreted.  There is a large amount of stool and bowel gas obscuring the renal outlines.  No definite calcifications suspicious for urinary tract stones are identified  Assessment & Plan:    1.  Recurrent nephrolithiasis Doing well No evidence of recurrent stone Continue general stone prevention guidelines 2 year follow-up with KUB and call earlier for recurrent stone symptoms   Abbie Sons, MD  [facility] [address] [place], [place] [address] [phone]

## 2022-08-09 DIAGNOSIS — H25011 Cortical age-related cataract, right eye: Secondary | ICD-10-CM | POA: Diagnosis not present

## 2022-08-15 ENCOUNTER — Emergency Department: Payer: PPO

## 2022-08-15 ENCOUNTER — Other Ambulatory Visit: Payer: Self-pay

## 2022-08-15 ENCOUNTER — Encounter: Payer: Self-pay | Admitting: Emergency Medicine

## 2022-08-15 ENCOUNTER — Emergency Department
Admission: EM | Admit: 2022-08-15 | Discharge: 2022-08-15 | Disposition: A | Discharge status: Home / Self Care | Payer: PPO | Attending: Emergency Medicine | Admitting: Emergency Medicine

## 2022-08-15 DIAGNOSIS — R519 Headache, unspecified: Secondary | ICD-10-CM | POA: Diagnosis not present

## 2022-08-15 DIAGNOSIS — J449 Chronic obstructive pulmonary disease, unspecified: Secondary | ICD-10-CM | POA: Diagnosis not present

## 2022-08-15 DIAGNOSIS — G43909 Migraine, unspecified, not intractable, without status migrainosus: Secondary | ICD-10-CM

## 2022-08-15 MED ORDER — HYDROMORPHONE HCL 1 MG/ML IJ SOLN
1.0000 mg | Freq: Once | INTRAMUSCULAR | Status: AC
  Administered 2022-08-15: 1 mg via INTRAVENOUS
  Filled 2022-08-15: qty 1

## 2022-08-15 MED ORDER — MORPHINE SULFATE (PF) 4 MG/ML IV SOLN
4.0000 mg | Freq: Once | INTRAVENOUS | Status: AC
  Administered 2022-08-15: 4 mg via INTRAVENOUS
  Filled 2022-08-15: qty 1

## 2022-08-15 MED ORDER — SODIUM CHLORIDE 0.9 % IV BOLUS
1000.0000 mL | Freq: Once | INTRAVENOUS | Status: AC
  Administered 2022-08-15: 1000 mL via INTRAVENOUS

## 2022-08-15 MED ORDER — KETOROLAC TROMETHAMINE 30 MG/ML IJ SOLN
15.0000 mg | Freq: Once | INTRAMUSCULAR | Status: AC
  Administered 2022-08-15: 15 mg via INTRAVENOUS
  Filled 2022-08-15: qty 1

## 2022-08-15 MED ORDER — OXYCODONE-ACETAMINOPHEN 5-325 MG PO TABS: 1.0000 | ORAL_TABLET | ORAL | 0 refills | Status: AC | PRN

## 2022-08-15 MED ORDER — DIPHENHYDRAMINE HCL 50 MG/ML IJ SOLN
25.0000 mg | Freq: Once | INTRAMUSCULAR | Status: AC
  Administered 2022-08-15: 25 mg via INTRAVENOUS
  Filled 2022-08-15: qty 1

## 2022-08-15 MED ORDER — UBRELVY 50 MG PO TABS: 50.0000 mg | ORAL_TABLET | Freq: Every day | ORAL | 0 refills | Status: AC | PRN

## 2022-08-15 MED ORDER — ONDANSETRON HCL 4 MG/2ML IJ SOLN
4.0000 mg | Freq: Once | INTRAMUSCULAR | Status: AC
  Administered 2022-08-15: 4 mg via INTRAVENOUS
  Filled 2022-08-15: qty 2
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
## 2022-08-15 NOTE — ED Notes (Signed)
Pt brought to car via wheelchair, husband is driving

## 2022-08-15 NOTE — ED Provider Notes (Signed)
[facility] Provider Note    Event Date/Time   First MD Initiated Contact with Patient 08/15/22 1139     (approximate)   History   Migraine   HPI  Valerie Ford is a 67 y.o. female with history of migraines, OSA, GERD, COPD and vitamin D deficiency presents emergency department with complaints of worsening migraine headache.  Patient has used all of her regular medications without any relief.  Symptoms been ongoing for 5 days.  States vomiting with pain.  Sensitivity to light.  Sensitivity to noise.  Blurred vision.  No trauma.      Physical Exam   Triage Vital Signs: ED Triage Vitals  Enc Vitals Group     BP 08/15/22 1054 (!) 143/78     Pulse Rate 08/15/22 1054 65     Resp 08/15/22 1054 18     Temp 08/15/22 1054 97.8 F (36.6 C)     Temp Source 08/15/22 1054 Oral     SpO2 08/15/22 1054 94 %     Weight --      Height --      Head Circumference --      Peak Flow --      Pain Score 08/15/22 1055 10     Pain Loc --      Pain Edu? --      Excl. in [place]? --     Most recent vital signs: Vitals:   08/15/22 1436 08/15/22 1532  BP: 104/63 125/68  Pulse: 66 68  Resp: 16 16  Temp:  97.9 F (36.6 C)  SpO2: 95% 93%     General: Awake, no distress.   CV:  Good peripheral perfusion. regular rate and  rhythm Resp:  Normal effort. Lungs cta Abd:  No distention.   Other:  Cranial nerves II to XII grossly intact, grips equal bilaterally   ED Results / Procedures / Treatments   Labs (all labs ordered are listed, but only abnormal results are displayed) Labs Reviewed - No data to display   EKG     RADIOLOGY CT of the head    PROCEDURES:   Procedures   MEDICATIONS ORDERED IN ED: Medications  sodium chloride 0.9 % bolus 1,000 mL (0 mLs Intravenous Stopped 08/15/22 1533)  ondansetron (ZOFRAN) injection 4 mg (4 mg Intravenous Given 08/15/22 1228)  diphenhydrAMINE (BENADRYL) injection 25 mg (25 mg Intravenous Given 08/15/22 1228)   morphine (PF) 4 MG/ML injection 4 mg (4 mg Intravenous Given 08/15/22 1229)  ketorolac (TORADOL) 30 MG/ML injection 15 mg (15 mg Intravenous Given 08/15/22 1413)  sodium chloride 0.9 % bolus 1,000 mL (0 mLs Intravenous Stopped 08/15/22 1534)  HYDROmorphone (DILAUDID) injection 1 mg (1 mg Intravenous Given 08/15/22 1438)     IMPRESSION / MDM / ASSESSMENT AND PLAN / ED COURSE  I reviewed the triage vital signs and the nursing notes.                              Differential diagnosis includes, but is not limited to, SAH, subdural, mass, migraine  Patient's presentation is most consistent with acute presentation with potential threat to life or bodily function.   Due to increasing severity of her migraines feel that a CT is warranted at this time.  Patient will be given a migraine cocktail.  She was given 1 L normal saline, Benadryl 25 mg IV, morphine 4 mg IV, Zofran 4 mg IV and  Toradol 4 mg IV.  CT of the head was reviewed by me.  Radiologist does not, any acute abnormality, interpret this as being negative  Patient continues to have pain.  Will order another bag of normal saline along with Dilaudid 1 mg IV   Patient finally had relief with the Dilaudid.  Will send in a refill prescription on her Roselyn Meier.  She will also be given a prescription for pain medication.  She is to increase her fluid intake.  Follow-up with her regular doctor on Thursday as scheduled.  Return emergency department worsening.  She was discharged in stable condition in the care of her husband.   FINAL CLINICAL IMPRESSION(S) / ED DIAGNOSES   Final diagnoses:  Acute migraine     Rx / DC Orders   ED Discharge Orders          Ordered    Ubrogepant (UBRELVY) 50 MG TABS  Daily PRN        08/15/22 1519    oxyCODONE-acetaminophen (PERCOCET) 5-325 MG tablet  Every 4 hours PRN        08/15/22 1526             Note:  This document was prepared using Dragon voice recognition software and may include  unintentional dictation errors.    Shakesha, [place], PA-C 08/15/22 1715    Harvest Dark, MD 08/16/22 1145

## 2022-08-18 DIAGNOSIS — R110 Nausea: Secondary | ICD-10-CM | POA: Diagnosis not present

## 2022-08-18 DIAGNOSIS — R519 Headache, unspecified: Secondary | ICD-10-CM | POA: Diagnosis not present

## 2022-08-18 DIAGNOSIS — G8929 Other chronic pain: Secondary | ICD-10-CM | POA: Diagnosis not present

## 2022-08-23 ENCOUNTER — Telehealth: Payer: Self-pay

## 2022-08-23 DIAGNOSIS — G43109 Migraine with aura, not intractable, without status migrainosus: Secondary | ICD-10-CM | POA: Diagnosis not present

## 2022-08-23 NOTE — Telephone Encounter (Signed)
        Patient  visited [place] on 2/5   Telephone encounter attempt :  1st   Unable to leave a message    [place], [place] [address], [place], [place] [address] Phone: [phone] Email: [email or web address]

## 2022-08-24 ENCOUNTER — Telehealth: Payer: Self-pay

## 2022-08-24 NOTE — Telephone Encounter (Signed)
        Patient  visited [facility] on 2/5    Telephone encounter attempt :  2nd  A HIPAA compliant voice message was left requesting a return call.  Instructed patient to call back .   [place] [phone] [address], [place], [place] [address] Phone: [phone] Email: [email or web address]

## 2022-08-31 ENCOUNTER — Encounter: Payer: Self-pay | Admitting: Ophthalmology

## 2022-09-01 NOTE — Discharge Instructions (Signed)

## 2022-09-06 ENCOUNTER — Other Ambulatory Visit: Payer: Self-pay

## 2022-09-06 ENCOUNTER — Ambulatory Visit: Payer: PPO | Admitting: Anesthesiology

## 2022-09-06 ENCOUNTER — Ambulatory Visit
Admission: RE | Admit: 2022-09-06 | Discharge: 2022-09-06 | Disposition: A | Discharge status: Home / Self Care | Payer: PPO | Source: Ambulatory Visit | Attending: Ophthalmology | Admitting: Ophthalmology

## 2022-09-06 ENCOUNTER — Encounter
Admission: RE | Disposition: A | Discharge status: Home / Self Care | Payer: Self-pay | Source: Ambulatory Visit | Attending: Ophthalmology

## 2022-09-06 ENCOUNTER — Encounter: Payer: Self-pay | Admitting: Ophthalmology

## 2022-09-06 DIAGNOSIS — Z87891 Personal history of nicotine dependence: Secondary | ICD-10-CM | POA: Insufficient documentation

## 2022-09-06 DIAGNOSIS — F909 Attention-deficit hyperactivity disorder, unspecified type: Secondary | ICD-10-CM | POA: Insufficient documentation

## 2022-09-06 DIAGNOSIS — H2511 Age-related nuclear cataract, right eye: Secondary | ICD-10-CM | POA: Insufficient documentation

## 2022-09-06 DIAGNOSIS — J449 Chronic obstructive pulmonary disease, unspecified: Secondary | ICD-10-CM | POA: Diagnosis not present

## 2022-09-06 DIAGNOSIS — Z9884 Bariatric surgery status: Secondary | ICD-10-CM | POA: Diagnosis not present

## 2022-09-06 DIAGNOSIS — K219 Gastro-esophageal reflux disease without esophagitis: Secondary | ICD-10-CM | POA: Insufficient documentation

## 2022-09-06 DIAGNOSIS — K760 Fatty (change of) liver, not elsewhere classified: Secondary | ICD-10-CM | POA: Insufficient documentation

## 2022-09-06 DIAGNOSIS — H25011 Cortical age-related cataract, right eye: Secondary | ICD-10-CM | POA: Diagnosis not present

## 2022-09-06 DIAGNOSIS — G4733 Obstructive sleep apnea (adult) (pediatric): Secondary | ICD-10-CM | POA: Diagnosis not present

## 2022-09-06 HISTORY — PX: CATARACT EXTRACTION W/PHACO: SHX586

## 2022-09-06 HISTORY — DX: Presence of dental prosthetic device (complete) (partial): Z97.2

## 2022-09-06 SURGERY — PHACOEMULSIFICATION, CATARACT, WITH IOL INSERTION
Anesthesia: Monitor Anesthesia Care | Site: Eye | Laterality: Right

## 2022-09-06 MED ORDER — SIGHTPATH DOSE#1 BSS IO SOLN
INTRAOCULAR | Status: DC | PRN
  Administered 2022-09-06: 15 mL

## 2022-09-06 MED ORDER — MOXIFLOXACIN HCL 0.5 % OP SOLN
OPHTHALMIC | Status: DC | PRN
  Administered 2022-09-06: .2 mL via OPHTHALMIC

## 2022-09-06 MED ORDER — SIGHTPATH DOSE#1 BSS IO SOLN
INTRAOCULAR | Status: DC | PRN
  Administered 2022-09-06: 1 mL

## 2022-09-06 MED ORDER — SIGHTPATH DOSE#1 BSS IO SOLN
INTRAOCULAR | Status: DC | PRN
  Administered 2022-09-06: 50 mL via OPHTHALMIC

## 2022-09-06 MED ORDER — TETRACAINE HCL 0.5 % OP SOLN
1.0000 [drp] | OPHTHALMIC | Status: DC | PRN
  Administered 2022-09-06 (×3): 1 [drp] via OPHTHALMIC

## 2022-09-06 MED ORDER — MIDAZOLAM HCL 2 MG/2ML IJ SOLN
INTRAMUSCULAR | Status: DC | PRN
  Administered 2022-09-06: 1 mg via INTRAVENOUS

## 2022-09-06 MED ORDER — BRIMONIDINE TARTRATE-TIMOLOL 0.2-0.5 % OP SOLN
OPHTHALMIC | Status: DC | PRN
  Administered 2022-09-06: 1 [drp] via OPHTHALMIC

## 2022-09-06 MED ORDER — SIGHTPATH DOSE#1 NA CHONDROIT SULF-NA HYALURON 40-17 MG/ML IO SOLN
INTRAOCULAR | Status: DC | PRN
  Administered 2022-09-06: 1 mL via INTRAOCULAR

## 2022-09-06 MED ORDER — ARMC OPHTHALMIC DILATING DROPS
1.0000 | OPHTHALMIC | Status: DC | PRN
  Administered 2022-09-06 (×3): 1 via OPHTHALMIC

## 2022-09-06 MED ORDER — FENTANYL CITRATE (PF) 100 MCG/2ML IJ SOLN
INTRAMUSCULAR | Status: DC | PRN
  Administered 2022-09-06: 50 ug via INTRAVENOUS

## 2022-09-06 SURGICAL SUPPLY — 15 items
CANNULA ANT/CHMB 27G (MISCELLANEOUS) IMPLANT
CANNULA ANT/CHMB 27GA (MISCELLANEOUS) IMPLANT
CATARACT SUITE SIGHTPATH (MISCELLANEOUS) ×1 IMPLANT
FEE CATARACT SUITE SIGHTPATH (MISCELLANEOUS) ×1 IMPLANT
GLOVE BIOGEL PI IND STRL 8 (GLOVE) ×1 IMPLANT
GLOVE SURG ENC TEXT LTX SZ8 (GLOVE) ×1 IMPLANT
LENS IOL TECNIS EYHANCE 21.5 (Intraocular Lens) IMPLANT
NDL FILTER BLUNT 18X1 1/2 (NEEDLE) ×1 IMPLANT
NEEDLE FILTER BLUNT 18X1 1/2 (NEEDLE) ×1 IMPLANT
PACK VIT ANT 23G (MISCELLANEOUS) IMPLANT
RING MALYGIN (MISCELLANEOUS) IMPLANT
SUT ETHILON 10-0 CS-B-6CS-B-6 (SUTURE)
SUTURE EHLN 10-0 CS-B-6CS-B-6 (SUTURE) IMPLANT
SYR 3ML LL SCALE MARK (SYRINGE) ×1 IMPLANT
WATER STERILE IRR 250ML POUR (IV SOLUTION) ×1 IMPLANT

## 2022-09-06 NOTE — Anesthesia Preprocedure Evaluation (Signed)
Anesthesia Evaluation  Patient identified by MRN, date of birth, ID band Patient awake    Reviewed: Allergy & Precautions, NPO status , Patient's Chart, lab work & pertinent test results  History of Anesthesia Complications Negative for: history of anesthetic complications  Airway Mallampati: III  TM Distance: >3 FB Neck ROM: Full    Dental no notable dental hx. (+) Teeth Intact   Pulmonary neg sleep apnea (diagnosed prior to gastric bypass surgery, hasn't ever had CPAP), COPD, Patient abstained from smoking.Not current smoker, former smoker osa diagnosed prior to gastric bypass, now not anymore   breath sounds clear to auscultation- rhonchi (-) wheezing      Cardiovascular Exercise Tolerance: Good (-) hypertension(-) CAD, (-) Past MI, (-) Cardiac Stents and (-) CABG  Rhythm:Regular Rate:Normal - Systolic murmurs and - Diastolic murmurs    Neuro/Psych  Headaches PSYCHIATRIC DISORDERS (ADHD)         GI/Hepatic Neg liver ROS,GERD  Medicated and Controlled,,  Endo/Other  negative endocrine ROSneg diabetes    Renal/GU negative Renal ROS     Musculoskeletal  (+) Arthritis ,    Abdominal  (+) - obese  Peds  Hematology negative hematology ROS (+)   Anesthesia Other Findings Past Medical History: 01/08/2018: ADHD 01/07/2018: Fatty liver 01/07/2018: GERD (gastroesophageal reflux disease) 01/07/2018: History of bariatric surgery 01/11/2018: History of cocaine use, remote, > 20 years 01/11/2018: Migraine with aura and without status migrainosus, not  intractable 10/20/2016: Obstructive sleep apnea syndrome 01/07/2018: Osteoarthritis of right knee 01/07/2018: Sacroiliitis ([place]) 01/11/2018: Seasonal allergic rhinitis due to pollen 01/11/2018: Vitamin D deficiency   Reproductive/Obstetrics                              Anesthesia Physical Anesthesia Plan  ASA: 2  Anesthesia Plan: MAC   Post-op Pain  Management:    Induction: Intravenous  PONV Risk Score and Plan: 1 and Midazolam  Airway Management Planned: Nasal Cannula  Additional Equipment:   Intra-op Plan:   Post-operative Plan:   Informed Consent: I have reviewed the patients History and Physical, chart, labs and discussed the procedure including the risks, benefits and alternatives for the proposed anesthesia with the patient or authorized representative who has indicated his/her understanding and acceptance.       Plan Discussed with: CRNA and Surgeon  Anesthesia Plan Comments: (Explained risks of anesthesia, including PONV, and rare emergencies such as cardiac events, respiratory problems, and allergic reactions, requiring invasive intervention. Discussed the role of CRNA in patient's perioperative care. Patient understands. )         Anesthesia Quick Evaluation

## 2022-09-06 NOTE — Transfer of Care (Signed)
Immediate Anesthesia Transfer of Care Note  Patient: Valerie Ford  Procedure(s) Performed: CATARACT EXTRACTION PHACO AND INTRAOCULAR LENS PLACEMENT (IOC) RIGHT (Right: Eye)  Patient Location: PACU  Anesthesia Type: MAC  Level of Consciousness: awake, alert  and patient cooperative  Airway and Oxygen Therapy: Patient Spontanous Breathing and Patient connected to supplemental oxygen  Post-op Assessment: Post-op Vital signs reviewed, Patient's Cardiovascular Status Stable, Respiratory Function Stable, Patent Airway and No signs of Nausea or vomiting  Post-op Vital Signs: Reviewed and stable  Complications: No notable events documented.

## 2022-09-06 NOTE — H&P (Signed)
[facility]   Primary Care Physician:  Idelle Crouch, MD Ophthalmologist: Dr. Hortense Ramal  Pre-Procedure History & Physical: HPI:  Valerie Ford is a 67 y.o. female here for cataract surgery.   Past Medical History:  Diagnosis Date   ADHD 01/08/2018   COPD (chronic obstructive pulmonary disease) ([place])    COVID-19    Dental bridge present    permanent, upper   Fatty liver 01/07/2018   GERD (gastroesophageal reflux disease) 01/07/2018   History of bariatric surgery 01/07/2018   History of cocaine use, remote, > 20 years 01/11/2018   History of kidney stones    Migraine with aura and without status migrainosus, not intractable 01/11/2018   was weekly.  Lessening with new meds   Obstructive sleep apnea syndrome 10/20/2016   Osteoarthritis of right knee 01/07/2018   Sacroiliitis ([place]) 01/07/2018   Seasonal allergic rhinitis due to pollen 01/11/2018   Vitamin D deficiency 01/11/2018   Wears dentures    Partial lower  ([place])    Past Surgical History:  Procedure Laterality Date   CESAREAN SECTION     CYSTOSCOPY/URETEROSCOPY/HOLMIUM LASER/STENT PLACEMENT Left 05/15/2018   Procedure: CYSTOSCOPY/URETEROSCOPY/HOLMIUM LASER/STONE REMOVAL/STENT PLACEMENT;  Surgeon: Abbie Sons, MD;  Location: [facility] ORS;  Service: Urology;  Laterality: Left;   CYSTOSCOPY/URETEROSCOPY/HOLMIUM LASER/STENT PLACEMENT Right 08/18/2020   Procedure: CYSTOSCOPY/URETEROSCOPY/HOLMIUM LASER/STONE REMOVAL/STENT PLACEMENT;  Surgeon: Abbie Sons, MD;  Location: [facility] ORS;  Service: Urology;  Laterality: Right;   ESOPHAGOGASTRODUODENOSCOPY N/A 11/16/2020   Procedure: ESOPHAGOGASTRODUODENOSCOPY (EGD);  Surgeon: Lesly Rubenstein, MD;  Location: [facility] ENDOSCOPY;  Service: Endoscopy;  Laterality: N/A;   GASTRIC ROUX-EN-Y     NASAL SINUS SURGERY      Prior to Admission medications   Medication Sig Start Date End Date Taking? Authorizing Provider  acetaminophen (TYLENOL) 500 MG tablet Take 1,000 mg by mouth  every 6 (six) hours as needed for moderate pain or headache.   Yes [provider]  amitriptyline (ELAVIL) 10 MG tablet Take 10 mg by mouth at bedtime.   Yes [provider]  amphetamine-dextroamphetamine (ADDERALL) 10 MG tablet Take 10 mg by mouth 3 (three) times daily. 01/05/18  Yes [provider]  Ascorbic Acid (VITAMIN C PO) Take by mouth daily.   Yes [provider]  baclofen (LIORESAL) 10 MG tablet Take 20 mg by mouth at bedtime. 04/11/20  Yes [provider]  BIOTIN PO Take by mouth daily.   Yes [provider]  celecoxib (CELEBREX) 200 MG capsule Take 200 mg by mouth daily.  01/31/17  Yes [provider]  cholecalciferol (VITAMIN D) 25 MCG (1000 UNIT) tablet Take 1,000 Units by mouth daily.   Yes [provider]  Cyanocobalamin (VITAMIN B-12 PO) Take by mouth daily.   Yes [provider]  FLUoxetine (PROZAC) 20 MG capsule Take 20 mg by mouth daily. 04/24/19  Yes [provider]  fluticasone (FLONASE) 50 MCG/ACT nasal spray Place 1 spray into both nostrils daily as needed for allergies or rhinitis.   Yes [provider]  Galcanezumab-gnlm 120 MG/ML SOAJ Inject into the skin. 04/18/22  Yes [provider]  Ginkgo Biloba (GNP GINGKO BILOBA EXTRACT PO) Take by mouth daily.   Yes [provider]  omeprazole (PRILOSEC) 40 MG capsule Take 40 mg by mouth daily. 05/27/20  Yes [provider]  ondansetron (ZOFRAN ODT) 4 MG disintegrating tablet Take 1 tablet (4 mg total) by mouth every 8 (eight) hours as needed. 07/12/20  Yes [place], [place],  MD  traZODone (DESYREL) 100 MG tablet Take 100 mg by mouth at bedtime. 04/06/20  Yes [provider]  verapamil (CALAN) 80 MG tablet Take 80 mg by mouth 2 (two) times daily. 05/27/20  Yes [provider]  albuterol (VENTOLIN HFA) 108 (90 Base) MCG/ACT inhaler Inhale 2 puffs into the lungs every 6 (six) hours as needed for  wheezing or shortness of breath. Patient not taking: Reported on 08/31/2022 08/03/20   [provider]  furosemide (LASIX) 20 MG tablet Take 20 mg by mouth daily as needed (swelling).  Patient not taking: Reported on 08/31/2022    [provider]  oxyCODONE-acetaminophen (PERCOCET) 5-325 MG tablet Take 1 tablet by mouth every 4 (four) hours as needed for severe pain. Patient not taking: Reported on 08/31/2022 08/15/22 08/15/23  Versie Starks, PA-C  potassium chloride (K-DUR) 10 MEQ tablet Take 10 mEq by mouth daily as needed (when taking furosemide.). Patient not taking: Reported on 08/31/2022    [provider]  Ubrogepant (UBRELVY) 50 MG TABS Take 1 tablet (50 mg total) by mouth daily as needed (migraine). Patient not taking: Reported on 08/31/2022 08/15/22   Versie Starks, PA-C    Allergies as of 08/08/2022 - Review Complete 07/27/2022  Allergen Reaction Noted   Gadolinium Nausea And Vomiting 07/24/2020   Sulfa antibiotics Hives and Rash 12/05/2008   Codeine Nausea And Vomiting 06/14/2006   Iodinated contrast media Nausea And Vomiting 11/23/2015   Poison ivy extract Rash 11/25/2015   Tramadol Nausea And Vomiting 01/20/2017    Family History  Problem Relation Age of Onset   Breast cancer Maternal Grandmother 44   Arthritis Maternal Grandmother    Asthma Maternal Grandmother    Depression Maternal Grandmother    Heart attack Maternal Grandmother    Arthritis Mother    Asthma Mother    COPD Mother    High Cholesterol Mother    Hypertension Mother    Stroke Mother    Heart disease Father    Hypertension Father    Alcohol abuse Sister    COPD Sister    Alcohol abuse Brother    Depression Brother    Drug abuse Brother     Social History   Socioeconomic History   Marital status: Married    Spouse name: Not on file   Number of children: Not on file   Years of education: Not on file   Highest education level: Not on file  Occupational History     Employer: [employer]  Tobacco Use   Smoking status: Former    Types: Cigarettes    Quit date: 2000    Years since quitting: 24.1   Smokeless tobacco: Never  Vaping Use   Vaping Use: Never used  Substance and Sexual Activity   Alcohol use: No   Drug use: Not Currently    Types: Cocaine   Sexual activity: Not on file  Other Topics Concern   Not on file  Social History Narrative   Not on file   Social Determinants of Health   Financial Resource Strain: Not on file  Food Insecurity: Not on file  Transportation Needs: Not on file  Physical Activity: Not on file  Stress: Not on file  Social Connections: Not on file  Intimate Partner Violence: Not on file    Review of Systems: See HPI, otherwise negative ROS  Physical Exam: BP 113/76   Temp 98.3 F (36.8 C)   Resp 12   Ht 5' 2.99" (1.6  m)   Wt 62.5 kg   LMP 03/17/2005 (Approximate)   SpO2 97%   BMI 24.42 kg/m  General:   Alert, cooperative in NAD Head:  Normocephalic and atraumatic. Respiratory:  Normal work of breathing. Cardiovascular:  RRR  Impression/Plan: ELFRIDA LASHWAY is here for cataract surgery.  Risks, benefits, limitations, and alternatives regarding cataract surgery have been reviewed with the patient.  Questions have been answered.  All parties agreeable.   Birder Robson, MD  09/06/2022, 10:33 AM

## 2022-09-06 NOTE — Op Note (Signed)
PREOPERATIVE DIAGNOSIS:  Nuclear sclerotic cataract of the right eye.   POSTOPERATIVE DIAGNOSIS:  Cataract   OPERATIVE PROCEDURE:ORPROCALL@   SURGEON:  Birder Robson, MD.   ANESTHESIA:  Anesthesiologist: Arita Miss, MD CRNA: Moises Blood, CRNA  1.      Managed anesthesia care. 2.      0.93m of Shugarcaine was instilled in the eye following the paracentesis.   COMPLICATIONS:  None.   TECHNIQUE:   Stop and chop   DESCRIPTION OF PROCEDURE:  The patient was examined and consented in the preoperative holding area where the aforementioned topical anesthesia was applied to the right eye and then brought back to the Operating Room where the right eye was prepped and draped in the usual sterile ophthalmic fashion and a lid speculum was placed. A paracentesis was created with the side port blade and the anterior chamber was filled with viscoelastic. A near clear corneal incision was performed with the steel keratome. A continuous curvilinear capsulorrhexis was performed with a cystotome followed by the capsulorrhexis forceps. Hydrodissection and hydrodelineation were carried out with BSS on a blunt cannula. The lens was removed in a stop and chop  technique and the remaining cortical material was removed with the irrigation-aspiration handpiece. The capsular bag was inflated with viscoelastic and the Technis [id number]  lens was placed in the capsular bag without complication. The remaining viscoelastic was removed from the eye with the irrigation-aspiration handpiece. The wounds were hydrated. The anterior chamber was flushed with BSS and the eye was inflated to physiologic pressure. 0.120mof Vigamox was placed in the anterior chamber. The wounds were found to be water tight. The eye was dressed with Combigan. The patient was given protective glasses to wear throughout the day and a shield with which to sleep tonight. The patient was also given drops with which to begin a drop regimen today and will  follow-up with me in one day. Implant Name Type Inv. Item Serial No. Manufacturer Lot No. LRB No. Used Action  LENS IOL TECNIS EYHANCE 21.5 - [id number] Lens LENS IOL TECNIS EYHANCE 21.5 [id number]  Right 1 Implanted   Procedure(s) with comments: CATARACT EXTRACTION PHACO AND INTRAOCULAR LENS PLACEMENT (IOC) RIGHT (Right) - 4.46 0:31.3  Electronically signed: WiBirder Robson/27/2024 10:56 AM

## 2022-09-06 NOTE — Anesthesia Postprocedure Evaluation (Signed)
Anesthesia Post Note  Patient: Valerie Ford  Procedure(s) Performed: CATARACT EXTRACTION PHACO AND INTRAOCULAR LENS PLACEMENT (IOC) RIGHT (Right: Eye)  Patient location during evaluation: PACU Anesthesia Type: MAC Level of consciousness: awake and alert Pain management: pain level controlled Vital Signs Assessment: post-procedure vital signs reviewed and stable Respiratory status: spontaneous breathing, nonlabored ventilation, respiratory function stable and patient connected to nasal cannula oxygen Cardiovascular status: stable and blood pressure returned to baseline Postop Assessment: no apparent nausea or vomiting Anesthetic complications: no   No notable events documented.   Last Vitals:  Vitals:   09/06/22 1056 09/06/22 1101  BP: (!) 146/70 (!) 142/89  Pulse: 63 63  Resp: 18 14  Temp: (!) 36.2 C (!) 36.2 C  SpO2: 95% 93%    Last Pain:  Vitals:   09/06/22 1101  PainSc: 0-No pain                 Arita Miss

## 2022-09-07 ENCOUNTER — Encounter: Payer: Self-pay | Admitting: Ophthalmology

## 2022-09-19 ENCOUNTER — Encounter: Payer: Self-pay | Admitting: Ophthalmology

## 2022-09-21 NOTE — Discharge Instructions (Signed)

## 2022-09-27 ENCOUNTER — Encounter
Admission: RE | Disposition: A | Discharge status: Home / Self Care | Payer: Self-pay | Source: Ambulatory Visit | Attending: Ophthalmology

## 2022-09-27 ENCOUNTER — Ambulatory Visit
Admission: RE | Admit: 2022-09-27 | Discharge: 2022-09-27 | Disposition: A | Discharge status: Home / Self Care | Payer: PPO | Source: Ambulatory Visit | Attending: Ophthalmology | Admitting: Ophthalmology

## 2022-09-27 ENCOUNTER — Ambulatory Visit: Payer: PPO | Admitting: Anesthesiology

## 2022-09-27 ENCOUNTER — Encounter: Payer: Self-pay | Admitting: Ophthalmology

## 2022-09-27 ENCOUNTER — Other Ambulatory Visit: Payer: Self-pay

## 2022-09-27 DIAGNOSIS — Z87891 Personal history of nicotine dependence: Secondary | ICD-10-CM | POA: Insufficient documentation

## 2022-09-27 DIAGNOSIS — G4733 Obstructive sleep apnea (adult) (pediatric): Secondary | ICD-10-CM | POA: Diagnosis not present

## 2022-09-27 DIAGNOSIS — H2512 Age-related nuclear cataract, left eye: Secondary | ICD-10-CM | POA: Diagnosis not present

## 2022-09-27 DIAGNOSIS — J449 Chronic obstructive pulmonary disease, unspecified: Secondary | ICD-10-CM | POA: Diagnosis not present

## 2022-09-27 DIAGNOSIS — K219 Gastro-esophageal reflux disease without esophagitis: Secondary | ICD-10-CM | POA: Diagnosis not present

## 2022-09-27 DIAGNOSIS — K760 Fatty (change of) liver, not elsewhere classified: Secondary | ICD-10-CM | POA: Diagnosis not present

## 2022-09-27 DIAGNOSIS — M1990 Unspecified osteoarthritis, unspecified site: Secondary | ICD-10-CM | POA: Insufficient documentation

## 2022-09-27 DIAGNOSIS — Z9884 Bariatric surgery status: Secondary | ICD-10-CM | POA: Diagnosis not present

## 2022-09-27 DIAGNOSIS — H25012 Cortical age-related cataract, left eye: Secondary | ICD-10-CM | POA: Diagnosis not present

## 2022-09-27 HISTORY — PX: CATARACT EXTRACTION W/PHACO: SHX586

## 2022-09-27 SURGERY — PHACOEMULSIFICATION, CATARACT, WITH IOL INSERTION
Anesthesia: Monitor Anesthesia Care | Site: Eye | Laterality: Left

## 2022-09-27 MED ORDER — MOXIFLOXACIN HCL 0.5 % OP SOLN
OPHTHALMIC | Status: DC | PRN
  Administered 2022-09-27: .2 mL via OPHTHALMIC

## 2022-09-27 MED ORDER — SIGHTPATH DOSE#1 BSS IO SOLN
INTRAOCULAR | Status: DC | PRN
  Administered 2022-09-27: 15 mL

## 2022-09-27 MED ORDER — MIDAZOLAM HCL 2 MG/2ML IJ SOLN
INTRAMUSCULAR | Status: DC | PRN
  Administered 2022-09-27: 1 mg via INTRAVENOUS

## 2022-09-27 MED ORDER — SIGHTPATH DOSE#1 BSS IO SOLN
INTRAOCULAR | Status: DC | PRN
  Administered 2022-09-27: 1 mL

## 2022-09-27 MED ORDER — ARMC OPHTHALMIC DILATING DROPS
1.0000 | OPHTHALMIC | Status: DC | PRN
  Administered 2022-09-27 (×2): 1 via OPHTHALMIC

## 2022-09-27 MED ORDER — BRIMONIDINE TARTRATE-TIMOLOL 0.2-0.5 % OP SOLN
OPHTHALMIC | Status: DC | PRN
  Administered 2022-09-27: 1 [drp] via OPHTHALMIC

## 2022-09-27 MED ORDER — SIGHTPATH DOSE#1 NA CHONDROIT SULF-NA HYALURON 40-17 MG/ML IO SOLN
INTRAOCULAR | Status: DC | PRN
  Administered 2022-09-27: 1 mL via INTRAOCULAR

## 2022-09-27 MED ORDER — SIGHTPATH DOSE#1 BSS IO SOLN
INTRAOCULAR | Status: DC | PRN
  Administered 2022-09-27: 64 mL via OPHTHALMIC

## 2022-09-27 MED ORDER — FENTANYL CITRATE (PF) 100 MCG/2ML IJ SOLN
INTRAMUSCULAR | Status: DC | PRN
  Administered 2022-09-27: 50 ug via INTRAVENOUS

## 2022-09-27 MED ORDER — TETRACAINE HCL 0.5 % OP SOLN
1.0000 [drp] | OPHTHALMIC | Status: DC | PRN
  Administered 2022-09-27 (×3): 1 [drp] via OPHTHALMIC

## 2022-09-27 SURGICAL SUPPLY — 15 items
CANNULA ANT/CHMB 27G (MISCELLANEOUS) IMPLANT
CANNULA ANT/CHMB 27GA (MISCELLANEOUS) IMPLANT
CATARACT SUITE SIGHTPATH (MISCELLANEOUS) ×1 IMPLANT
FEE CATARACT SUITE SIGHTPATH (MISCELLANEOUS) ×1 IMPLANT
GLOVE BIOGEL PI IND STRL 8 (GLOVE) ×1 IMPLANT
GLOVE SURG ENC TEXT LTX SZ8 (GLOVE) ×1 IMPLANT
LENS IOL TECNIS EYHANCE 22.0 (Intraocular Lens) IMPLANT
NDL FILTER BLUNT 18X1 1/2 (NEEDLE) ×1 IMPLANT
NEEDLE FILTER BLUNT 18X1 1/2 (NEEDLE) ×1 IMPLANT
PACK VIT ANT 23G (MISCELLANEOUS) IMPLANT
RING MALYGIN (MISCELLANEOUS) IMPLANT
SUT ETHILON 10-0 CS-B-6CS-B-6 (SUTURE)
SUTURE EHLN 10-0 CS-B-6CS-B-6 (SUTURE) IMPLANT
SYR 3ML LL SCALE MARK (SYRINGE) ×1 IMPLANT
WATER STERILE IRR 250ML POUR (IV SOLUTION) ×1 IMPLANT

## 2022-09-27 NOTE — Transfer of Care (Signed)
Immediate Anesthesia Transfer of Care Note  Patient: Valerie Ford  Procedure(s) Performed: CATARACT EXTRACTION PHACO AND INTRAOCULAR LENS PLACEMENT (IOC) LEFT (Left: Eye)  Patient Location: PACU  Anesthesia Type: MAC  Level of Consciousness: awake, alert  and patient cooperative  Airway and Oxygen Therapy: Patient Spontanous Breathing and Patient connected to supplemental oxygen  Post-op Assessment: Post-op Vital signs reviewed, Patient's Cardiovascular Status Stable, Respiratory Function Stable, Patent Airway and No signs of Nausea or vomiting  Post-op Vital Signs: Reviewed and stable  Complications: No notable events documented.

## 2022-09-27 NOTE — H&P (Signed)
[facility]   Primary Care Physician:  Idelle Crouch, MD Ophthalmologist: Dr. Hortense Ramal  Pre-Procedure History & Physical: HPI:  Valerie Ford is a 67 y.o. female here for cataract surgery.   Past Medical History:  Diagnosis Date   ADHD 01/08/2018   COPD (chronic obstructive pulmonary disease) ([place])    COVID-19    Dental bridge present    permanent, upper   Fatty liver 01/07/2018   GERD (gastroesophageal reflux disease) 01/07/2018   History of bariatric surgery 01/07/2018   History of cocaine use, remote, > 20 years 01/11/2018   History of kidney stones    Migraine with aura and without status migrainosus, not intractable 01/11/2018   was weekly.  Lessening with new meds   Obstructive sleep apnea syndrome 10/20/2016   Osteoarthritis of right knee 01/07/2018   Sacroiliitis ([place]) 01/07/2018   Seasonal allergic rhinitis due to pollen 01/11/2018   Vitamin D deficiency 01/11/2018   Wears dentures    Partial lower  ([place])    Past Surgical History:  Procedure Laterality Date   CATARACT EXTRACTION W/PHACO Right 09/06/2022   Procedure: CATARACT EXTRACTION PHACO AND INTRAOCULAR LENS PLACEMENT ([place]) RIGHT;  Surgeon: Birder Robson, MD;  Location: [place];  Service: Ophthalmology;  Laterality: Right;  4.46 0:31.3   CESAREAN SECTION     CYSTOSCOPY/URETEROSCOPY/HOLMIUM LASER/STENT PLACEMENT Left 05/15/2018   Procedure: CYSTOSCOPY/URETEROSCOPY/HOLMIUM LASER/STONE REMOVAL/STENT PLACEMENT;  Surgeon: Abbie Sons, MD;  Location: [facility] ORS;  Service: Urology;  Laterality: Left;   CYSTOSCOPY/URETEROSCOPY/HOLMIUM LASER/STENT PLACEMENT Right 08/18/2020   Procedure: CYSTOSCOPY/URETEROSCOPY/HOLMIUM LASER/STONE REMOVAL/STENT PLACEMENT;  Surgeon: Abbie Sons, MD;  Location: [facility];  Service: Urology;  Laterality: Right;   ESOPHAGOGASTRODUODENOSCOPY N/A 11/16/2020   Procedure: ESOPHAGOGASTRODUODENOSCOPY (EGD);  Surgeon: Lesly Rubenstein, MD;  [facility] END[facility]COPY;   Service: Endoscopy;  Laterality: N/A;   GASTRIC ROUX-EN-Y     NASAL SINUS SURGERY      Prior to Admission medications   Medication Sig Start Date End Date Taking? Authorizing Provider  acetaminophen (TYLENOL) 500 MG tablet Take 1,000 mg by mouth every 6 (six) hours as needed for moderate pain or headache.   Yes [provider]  amitriptyline (ELAVIL) 10 MG tablet Take 10 mg by mouth at bedtime.   Yes [provider]  amphetamine-dextroamphetamine (ADDERALL) 10 MG tablet Take 10 mg by mouth 3 (three) times daily. 01/05/18  Yes [provider]  Ascorbic Acid (VITAMIN C PO) Take by mouth daily.   Yes [provider]  baclofen (LIORESAL) 10 MG tablet Take 20 mg by mouth at bedtime. 04/11/20  Yes [provider]  BIOTIN PO Take by mouth daily.   Yes [provider]  celecoxib (CELEBREX) 200 MG capsule Take 200 mg by mouth daily.  01/31/17  Yes [provider]  cholecalciferol (VITAMIN D) 25 MCG (1000 UNIT) tablet Take 1,000 Units by mouth daily.   Yes [provider]  Cyanocobalamin (VITAMIN B-12 PO) Take by mouth daily.   Yes [provider]  FLUoxetine (PROZAC) 20 MG capsule Take 20 mg by mouth daily. 04/24/19  Yes [provider]  fluticasone (FLONASE) 50 MCG/ACT nasal spray Place 1 spray into both nostrils daily as needed for allergies or rhinitis.   Yes [provider]  Galcanezumab-gnlm 120 MG/ML SOAJ Inject into the skin. 04/18/22  Yes [provider]  Ginkgo Biloba (GNP GINGKO BILOBA EXTRACT PO) Take by mouth daily.   Yes [provider]  omeprazole (PRILOSEC) 40 MG capsule Take  40 mg by mouth daily. 05/27/20  Yes [provider]  ondansetron (ZOFRAN ODT) 4 MG disintegrating tablet Take 1 tablet (4 mg total) by mouth every 8 (eight) hours as needed. 07/12/20  Yes Alfred Levins, [place], MD  traZODone (DESYREL) 100 MG tablet Take 100 mg by mouth at bedtime. 04/06/20  Yes  [provider]  verapamil (CALAN) 80 MG tablet Take 80 mg by mouth 2 (two) times daily. 05/27/20  Yes [provider]  albuterol (VENTOLIN HFA) 108 (90 Base) MCG/ACT inhaler Inhale 2 puffs into the lungs every 6 (six) hours as needed for wheezing or shortness of breath. Patient not taking: Reported on 08/31/2022 08/03/20   [provider]  furosemide (LASIX) 20 MG tablet Take 20 mg by mouth daily as needed (swelling).  Patient not taking: Reported on 08/31/2022    [provider]  oxyCODONE-acetaminophen (PERCOCET) 5-325 MG tablet Take 1 tablet by mouth every 4 (four) hours as needed for severe pain. Patient not taking: Reported on 08/31/2022 08/15/22 08/15/23  Versie Starks, PA-C  potassium chloride (K-DUR) 10 MEQ tablet Take 10 mEq by mouth daily as needed (when taking furosemide.). Patient not taking: Reported on 08/31/2022    [provider]  Ubrogepant (UBRELVY) 50 MG TABS Take 1 tablet (50 mg total) by mouth daily as needed (migraine). Patient not taking: Reported on 08/31/2022 08/15/22   Versie Starks, PA-C    Allergies as of 08/08/2022 - Review Complete 07/27/2022  Allergen Reaction Noted   Gadolinium Nausea And Vomiting 07/24/2020   Sulfa antibiotics Hives and Rash 12/05/2008   Codeine Nausea And Vomiting 06/14/2006   Iodinated contrast media Nausea And Vomiting 11/23/2015   Poison ivy extract Rash 11/25/2015   Tramadol Nausea And Vomiting 01/20/2017    Family History  Problem Relation Age of Onset   Breast cancer Maternal Grandmother 43   Arthritis Maternal Grandmother    Asthma Maternal Grandmother    Depression Maternal Grandmother    Heart attack Maternal Grandmother    Arthritis Mother    Asthma Mother    COPD Mother    High Cholesterol Mother    Hypertension Mother    Stroke Mother    Heart disease Father    Hypertension Father    Alcohol abuse Sister    COPD Sister    Alcohol abuse Brother    Depression Brother    Drug  abuse Brother     Social History   Socioeconomic History   Marital status: Married    Spouse name: Not on file   Number of children: Not on file   Years of education: Not on file   Highest education level: Not on file  Occupational History    Employer: [employer]  Tobacco Use   Smoking status: Former    Types: Cigarettes    Quit date: 2000    Years since quitting: 24.2   Smokeless tobacco: Never  Vaping Use   Vaping Use: Never used  Substance and Sexual Activity   Alcohol use: No   Drug use: Not Currently    Types: Cocaine   Sexual activity: Not on file  Other Topics Concern   Not on file  Social History Narrative   Not on file   Social Determinants of Health   Financial Resource Strain: Not on file  Food Insecurity: Not on file  Transportation Needs: Not on file  Physical Activity: Not on file  Stress: Not on file  Social Connections: Not on file  Intimate  Partner Violence: Not on file    Review of Systems: See HPI, otherwise negative ROS  Physical Exam: BP 128/73   Temp (!) 97.3 F (36.3 C) (Tympanic)   Ht 5' 2.99" (1.6 m)   Wt 62.9 kg   LMP 03/17/2005 (Approximate)   SpO2 97%   BMI 24.56 kg/m  General:   Alert, cooperative in NAD Head:  Normocephalic and atraumatic. Respiratory:  Normal work of breathing. Cardiovascular:  RRR  Impression/Plan: Valerie Ford is here for cataract surgery.  Risks, benefits, limitations, and alternatives regarding cataract surgery have been reviewed with the patient.  Questions have been answered.  All parties agreeable.   Birder Robson, MD  09/27/2022, 12:11 PM

## 2022-09-27 NOTE — Anesthesia Preprocedure Evaluation (Signed)
Anesthesia Evaluation  Patient identified by MRN, date of birth, ID band Patient awake    Reviewed: Allergy & Precautions, NPO status , Patient's Chart, lab work & pertinent test results  History of Anesthesia Complications Negative for: history of anesthetic complications  Airway Mallampati: III  TM Distance: >3 FB Neck ROM: Full    Dental no notable dental hx. (+) Teeth Intact   Pulmonary neg sleep apnea (diagnosed prior to gastric bypass surgery, hasn't ever had CPAP), COPD, Patient abstained from smoking.Not current smoker, former smoker osa diagnosed prior to gastric bypass, now not anymore   breath sounds clear to auscultation- rhonchi (-) wheezing      Cardiovascular Exercise Tolerance: Good (-) hypertension(-) CAD, (-) Past MI, (-) Cardiac Stents and (-) CABG  Rhythm:Regular Rate:Normal - Systolic murmurs and - Diastolic murmurs    Neuro/Psych  Headaches PSYCHIATRIC DISORDERS (ADHD)         GI/Hepatic Neg liver ROS,GERD  Medicated and Controlled,,  Endo/Other  negative endocrine ROSneg diabetes    Renal/GU negative Renal ROS     Musculoskeletal  (+) Arthritis ,    Abdominal  (+) - obese  Peds  Hematology negative hematology ROS (+)   Anesthesia Other Findings Past Medical History: 01/08/2018: ADHD 01/07/2018: Fatty liver 01/07/2018: GERD (gastroesophageal reflux disease) 01/07/2018: History of bariatric surgery 01/11/2018: History of cocaine use, remote, > 20 years 01/11/2018: Migraine with aura and without status migrainosus, not  intractable 10/20/2016: Obstructive sleep apnea syndrome 01/07/2018: Osteoarthritis of right knee 01/07/2018: Sacroiliitis (HCC) 01/11/2018: Seasonal allergic rhinitis due to pollen 01/11/2018: Vitamin D deficiency   Reproductive/Obstetrics                              Anesthesia Physical Anesthesia Plan  ASA: 2  Anesthesia Plan: MAC   Post-op Pain  Management:    Induction: Intravenous  PONV Risk Score and Plan: 1 and Midazolam  Airway Management Planned: Nasal Cannula  Additional Equipment:   Intra-op Plan:   Post-operative Plan:   Informed Consent: I have reviewed the patients History and Physical, chart, labs and discussed the procedure including the risks, benefits and alternatives for the proposed anesthesia with the patient or authorized representative who has indicated his/her understanding and acceptance.       Plan Discussed with: CRNA and Surgeon  Anesthesia Plan Comments: (Explained risks of anesthesia, including PONV, and rare emergencies such as cardiac events, respiratory problems, and allergic reactions, requiring invasive intervention. Discussed the role of CRNA in patient's perioperative care. Patient understands. )         Anesthesia Quick Evaluation  

## 2022-09-27 NOTE — Op Note (Signed)
PREOPERATIVE DIAGNOSIS:  Nuclear sclerotic cataract of the left eye.   POSTOPERATIVE DIAGNOSIS:  Nuclear sclerotic cataract of the left eye.   OPERATIVE PROCEDURE:ORPROCALL@   SURGEON:  Birder Robson, MD.   ANESTHESIA:  Anesthesiologist: Ilene Qua, MD CRNA: Moises Blood, CRNA  1.      Managed anesthesia care. 2.     0.72ml of Shugarcaine was instilled following the paracentesis   COMPLICATIONS:  None.   TECHNIQUE:   Stop and chop   DESCRIPTION OF PROCEDURE:  The patient was examined and consented in the preoperative holding area where the aforementioned topical anesthesia was applied to the left eye and then brought back to the Operating Room where the left eye was prepped and draped in the usual sterile ophthalmic fashion and a lid speculum was placed. A paracentesis was created with the side port blade and the anterior chamber was filled with viscoelastic. A near clear corneal incision was performed with the steel keratome. A continuous curvilinear capsulorrhexis was performed with a cystotome followed by the capsulorrhexis forceps. Hydrodissection and hydrodelineation were carried out with BSS on a blunt cannula. The lens was removed in a stop and chop  technique and the remaining cortical material was removed with the irrigation-aspiration handpiece. The capsular bag was inflated with viscoelastic and the Technis [id number] lens was placed in the capsular bag without complication. The remaining viscoelastic was removed from the eye with the irrigation-aspiration handpiece. The wounds were hydrated. The anterior chamber was flushed with BSS and the eye was inflated to physiologic pressure. 0.35ml Vigamox was placed in the anterior chamber. The wounds were found to be water tight. The eye was dressed with Combigan. The patient was given protective glasses to wear throughout the day and a shield with which to sleep tonight. The patient was also given drops with which to begin a drop regimen  today and will follow-up with me in one day. Implant Name Type Inv. Item Serial No. Manufacturer Lot No. LRB No. Used Action  LENS IOL TECNIS EYHANCE 22.0 - [id number] Intraocular Lens LENS IOL TECNIS EYHANCE 22.0 [id number] [employer]  Left 1 Implanted    Procedure(s) with comments: CATARACT EXTRACTION PHACO AND INTRAOCULAR LENS PLACEMENT (IOC) LEFT (Left) - 5.06 0:39.2  Electronically signed: Birder Robson 09/27/2022 12:34 PM

## 2022-09-27 NOTE — Anesthesia Postprocedure Evaluation (Signed)
Anesthesia Post Note  Patient: Valerie Ford  Procedure(s) Performed: CATARACT EXTRACTION PHACO AND INTRAOCULAR LENS PLACEMENT (IOC) LEFT (Left: Eye)  Patient location during evaluation: PACU Anesthesia Type: MAC Level of consciousness: awake and alert Pain management: pain level controlled Vital Signs Assessment: post-procedure vital signs reviewed and stable Respiratory status: spontaneous breathing, nonlabored ventilation, respiratory function stable and patient connected to nasal cannula oxygen Cardiovascular status: blood pressure returned to baseline and stable Postop Assessment: no apparent nausea or vomiting Anesthetic complications: no   No notable events documented.   Last Vitals:  Vitals:   09/27/22 1235 09/27/22 1240  BP: 123/70 128/68  Pulse: 65 65  Resp: 10 13  Temp: (!) 36.4 C (!) 36.4 C  SpO2: 94% 96%    Last Pain:  Vitals:   09/27/22 1240  TempSrc:   PainSc: 0-No pain                 Ilene Qua

## 2022-09-28 ENCOUNTER — Encounter: Payer: Self-pay | Admitting: Ophthalmology

## 2022-10-19 DIAGNOSIS — Z961 Presence of intraocular lens: Secondary | ICD-10-CM | POA: Diagnosis not present

## 2022-10-24 ENCOUNTER — Other Ambulatory Visit: Payer: Self-pay | Admitting: Nurse Practitioner

## 2022-11-09 ENCOUNTER — Ambulatory Visit
Admission: RE | Admit: 2022-11-09 | Discharge: 2022-11-09 | Disposition: A | Discharge status: Home / Self Care | Payer: PPO | Source: Ambulatory Visit | Attending: Internal Medicine | Admitting: Internal Medicine

## 2022-11-09 DIAGNOSIS — Z1231 Encounter for screening mammogram for malignant neoplasm of breast: Secondary | ICD-10-CM | POA: Diagnosis not present

## 2022-12-14 DIAGNOSIS — M7062 Trochanteric bursitis, left hip: Secondary | ICD-10-CM | POA: Diagnosis not present

## 2022-12-28 DIAGNOSIS — M461 Sacroiliitis, not elsewhere classified: Secondary | ICD-10-CM | POA: Diagnosis not present

## 2023-01-09 DIAGNOSIS — Z03818 Encounter for observation for suspected exposure to other biological agents ruled out: Secondary | ICD-10-CM | POA: Diagnosis not present

## 2023-01-09 DIAGNOSIS — Z20818 Contact with and (suspected) exposure to other bacterial communicable diseases: Secondary | ICD-10-CM | POA: Diagnosis not present

## 2023-01-09 DIAGNOSIS — J029 Acute pharyngitis, unspecified: Secondary | ICD-10-CM | POA: Diagnosis not present

## 2023-01-09 DIAGNOSIS — R051 Acute cough: Secondary | ICD-10-CM | POA: Diagnosis not present

## 2023-01-18 DIAGNOSIS — M7062 Trochanteric bursitis, left hip: Secondary | ICD-10-CM | POA: Diagnosis not present

## 2023-02-06 DIAGNOSIS — F909 Attention-deficit hyperactivity disorder, unspecified type: Secondary | ICD-10-CM | POA: Diagnosis not present

## 2023-02-06 DIAGNOSIS — K219 Gastro-esophageal reflux disease without esophagitis: Secondary | ICD-10-CM | POA: Diagnosis not present

## 2023-02-06 DIAGNOSIS — J069 Acute upper respiratory infection, unspecified: Secondary | ICD-10-CM | POA: Diagnosis not present

## 2023-02-06 DIAGNOSIS — M25552 Pain in left hip: Secondary | ICD-10-CM | POA: Diagnosis not present

## 2023-02-06 DIAGNOSIS — Z0000 Encounter for general adult medical examination without abnormal findings: Secondary | ICD-10-CM | POA: Diagnosis not present

## 2023-02-06 DIAGNOSIS — E559 Vitamin D deficiency, unspecified: Secondary | ICD-10-CM | POA: Diagnosis not present

## 2023-02-06 DIAGNOSIS — J431 Panlobular emphysema: Secondary | ICD-10-CM | POA: Diagnosis not present

## 2023-02-06 DIAGNOSIS — Z03818 Encounter for observation for suspected exposure to other biological agents ruled out: Secondary | ICD-10-CM | POA: Diagnosis not present

## 2023-02-06 DIAGNOSIS — E162 Hypoglycemia, unspecified: Secondary | ICD-10-CM | POA: Diagnosis not present

## 2023-02-21 DIAGNOSIS — M7062 Trochanteric bursitis, left hip: Secondary | ICD-10-CM | POA: Diagnosis not present

## 2023-02-21 DIAGNOSIS — M5416 Radiculopathy, lumbar region: Secondary | ICD-10-CM | POA: Diagnosis not present

## 2023-02-21 DIAGNOSIS — M25552 Pain in left hip: Secondary | ICD-10-CM | POA: Diagnosis not present

## 2023-04-20 DIAGNOSIS — Z03818 Encounter for observation for suspected exposure to other biological agents ruled out: Secondary | ICD-10-CM | POA: Diagnosis not present

## 2023-04-20 DIAGNOSIS — J019 Acute sinusitis, unspecified: Secondary | ICD-10-CM | POA: Diagnosis not present

## 2023-05-12 DIAGNOSIS — M7062 Trochanteric bursitis, left hip: Secondary | ICD-10-CM | POA: Diagnosis not present

## 2023-05-12 DIAGNOSIS — R519 Headache, unspecified: Secondary | ICD-10-CM | POA: Diagnosis not present

## 2023-05-12 DIAGNOSIS — G8929 Other chronic pain: Secondary | ICD-10-CM | POA: Diagnosis not present

## 2023-05-31 DIAGNOSIS — M5416 Radiculopathy, lumbar region: Secondary | ICD-10-CM | POA: Diagnosis not present

## 2023-05-31 DIAGNOSIS — G8929 Other chronic pain: Secondary | ICD-10-CM | POA: Diagnosis not present

## 2023-05-31 DIAGNOSIS — M5442 Lumbago with sciatica, left side: Secondary | ICD-10-CM | POA: Diagnosis not present

## 2023-06-15 DIAGNOSIS — M5416 Radiculopathy, lumbar region: Secondary | ICD-10-CM | POA: Diagnosis not present

## 2023-06-29 DIAGNOSIS — G8929 Other chronic pain: Secondary | ICD-10-CM | POA: Diagnosis not present

## 2023-06-29 DIAGNOSIS — M5442 Lumbago with sciatica, left side: Secondary | ICD-10-CM | POA: Diagnosis not present

## 2023-06-29 DIAGNOSIS — M5416 Radiculopathy, lumbar region: Secondary | ICD-10-CM | POA: Diagnosis not present

## 2023-07-17 DIAGNOSIS — M5416 Radiculopathy, lumbar region: Secondary | ICD-10-CM | POA: Diagnosis not present

## 2023-07-24 ENCOUNTER — Telehealth: Payer: PPO | Admitting: Family Medicine

## 2023-07-24 DIAGNOSIS — B9689 Other specified bacterial agents as the cause of diseases classified elsewhere: Secondary | ICD-10-CM

## 2023-07-24 DIAGNOSIS — J019 Acute sinusitis, unspecified: Secondary | ICD-10-CM

## 2023-07-24 MED ORDER — AMOXICILLIN-POT CLAVULANATE 875-125 MG PO TABS: 1.0000 | ORAL_TABLET | Freq: Two times a day (BID) | ORAL | 0 refills | Status: AC

## 2023-07-24 NOTE — Patient Instructions (Signed)
 Valerie Ford, thank you for joining Valerie CHRISTELLA Barefoot, NP for today's virtual visit.  While this provider is not your primary care provider (PCP), if your PCP is located in our provider database this encounter information will be shared with them immediately following your visit.   A [facility] MyChart account gives you access to today's visit and all your visits, tests, and labs performed at [facility]  click here if you don't have a [facility] MyChart account or go to [email or web address]  Consent: (Patient) Valerie Ford provided verbal consent for this virtual visit at the beginning of the encounter.  Current Medications:  Current Outpatient Medications:    amoxicillin -clavulanate (AUGMENTIN ) 875-125 MG tablet, Take 1 tablet by mouth 2 (two) times daily for 7 days., Disp: 14 tablet, Rfl: 0   acetaminophen  (TYLENOL ) 500 MG tablet, Take 1,000 mg by mouth every 6 (six) hours as needed for moderate pain or headache., Disp: , Rfl:    albuterol (VENTOLIN HFA) 108 (90 Base) MCG/ACT inhaler, Inhale 2 puffs into the lungs every 6 (six) hours as needed for wheezing or shortness of breath. (Patient not taking: Reported on 08/31/2022), Disp: , Rfl:    amitriptyline (ELAVIL) 10 MG tablet, Take 10 mg by mouth at bedtime., Disp: , Rfl:    amphetamine-dextroamphetamine (ADDERALL) 10 MG tablet, Take 10 mg by mouth 3 (three) times daily., Disp: , Rfl: 0   Ascorbic Acid (VITAMIN C PO), Take by mouth daily., Disp: , Rfl:    baclofen  (LIORESAL ) 10 MG tablet, Take 20 mg by mouth at bedtime., Disp: , Rfl:    BIOTIN PO, Take by mouth daily., Disp: , Rfl:    celecoxib (CELEBREX) 200 MG capsule, Take 200 mg by mouth daily. , Disp: , Rfl:    cholecalciferol (VITAMIN D) 25 MCG (1000 UNIT) tablet, Take 1,000 Units by mouth daily., Disp: , Rfl:    Cyanocobalamin  (VITAMIN B-12 PO), Take by mouth daily., Disp: , Rfl:    FLUoxetine (PROZAC) 20 MG capsule, Take 20 mg by mouth daily., Disp: , Rfl:     fluticasone (FLONASE) 50 MCG/ACT nasal spray, Place 1 spray into both nostrils daily as needed for allergies or rhinitis., Disp: , Rfl:    furosemide (LASIX) 20 MG tablet, Take 20 mg by mouth daily as needed (swelling).  (Patient not taking: Reported on 08/31/2022), Disp: , Rfl:    Galcanezumab-gnlm 120 MG/ML SOAJ, Inject into the skin., Disp: , Rfl:    Ginkgo Biloba (GNP GINGKO BILOBA EXTRACT PO), Take by mouth daily., Disp: , Rfl:    omeprazole (PRILOSEC) 40 MG capsule, Take 40 mg by mouth daily., Disp: , Rfl:    ondansetron  (ZOFRAN  ODT) 4 MG disintegrating tablet, Take 1 tablet (4 mg total) by mouth every 8 (eight) hours as needed., Disp: 20 tablet, Rfl: 0   oxyCODONE -acetaminophen  (PERCOCET) 5-325 MG tablet, Take 1 tablet by mouth every 4 (four) hours as needed for severe pain. (Patient not taking: Reported on 08/31/2022), Disp: 10 tablet, Rfl: 0   potassium chloride (K-DUR) 10 MEQ tablet, Take 10 mEq by mouth daily as needed (when taking furosemide.). (Patient not taking: Reported on 08/31/2022), Disp: , Rfl:    traZODone (DESYREL) 100 MG tablet, Take 100 mg by mouth at bedtime., Disp: , Rfl:    Ubrogepant  (UBRELVY ) 50 MG TABS, Take 1 tablet (50 mg total) by mouth daily as needed (migraine). (Patient not taking: Reported on 08/31/2022), Disp: 30 tablet, Rfl: 0   verapamil (CALAN) 80 MG tablet,  Take 80 mg by mouth 2 (two) times daily., Disp: , Rfl:    Medications ordered in this encounter:  Meds ordered this encounter  Medications   amoxicillin -clavulanate (AUGMENTIN ) 875-125 MG tablet    Sig: Take 1 tablet by mouth 2 (two) times daily for 7 days.    Dispense:  14 tablet    Refill:  0    Supervising Provider:   BLAISE ALEENE KIDD [[id number]]     *If you need refills on other medications prior to your next appointment, please contact your pharmacy*  Follow-Up: Call back or seek an in-person evaluation if the symptoms worsen or if the condition fails to improve as anticipated.  [facility] [phone]  Other Instructions - Increased rest - Increasing Fluids - Acetaminophen  / ibuprofen  as needed for fever/pain.  - Salt water gargling, chloraseptic spray and throat lozenges - Mucinex if mucus is present and increasing.  - Saline nasal spray if congestion or if nasal passages feel dry. - Humidifying the air.    If you have been instructed to have an in-person evaluation today at a local Urgent Care facility, please use the link below. It will take you to a list of all of our available [facility], including address, phone number and hours of operation. Please do not delay care.  [facility]  If you or a family member do not have a primary care provider, use the link below to schedule a visit and establish care. When you choose a [facility] primary care physician or advanced practice provider, you gain a long-term partner in health. Find a Primary Care Provider  Learn more about [facility]'s in-office and virtual care options: [facility] - Get Care Now

## 2023-07-24 NOTE — Progress Notes (Signed)
 Virtual Visit Consent   Valerie Ford, you are scheduled for a virtual visit with a [facility] provider today. Just as with appointments in the office, your consent must be obtained to participate. Your consent will be active for this visit and any virtual visit you may have with one of our providers in the next 365 days. If you have a MyChart account, a copy of this consent can be sent to you electronically.  As this is a virtual visit, video technology does not allow for your provider to perform a traditional examination. This may limit your provider's ability to fully assess your condition. If your provider identifies any concerns that need to be evaluated in person or the need to arrange testing (such as labs, EKG, etc.), we will make arrangements to do so. Although advances in technology are sophisticated, we cannot ensure that it will always work on either your end or our end. If the connection with a video visit is poor, the visit may have to be switched to a telephone visit. With either a video or telephone visit, we are not always able to ensure that we have a secure connection.  By engaging in this virtual visit, you consent to the provision of healthcare and authorize for your insurance to be billed (if applicable) for the services provided during this visit. Depending on your insurance coverage, you may receive a charge related to this service.  I need to obtain your verbal consent now. Are you willing to proceed with your visit today? [name] has provided verbal consent on 07/24/2023 for a virtual visit (video or telephone). Chiquita CHRISTELLA Barefoot, NP  Date: 07/24/2023 1:28 PM  Virtual Visit via Video Note   I, Chiquita CHRISTELLA Barefoot, connected with  RAFIA SHEDDEN  ([id number], 1955/08/03) on 07/24/23 at  1:30 PM EST by a video-enabled telemedicine application and verified that I am speaking with the correct person using two identifiers.  Location: Patient: Virtual Visit Location Patient:  Home Provider: Virtual Visit Location Provider: Home Office   I discussed the limitations of evaluation and management by telemedicine and the availability of in person appointments. The patient expressed understanding and agreed to proceed.    History of Present Illness: Valerie Ford is a 68 y.o. who identifies as a female who was assigned female at birth, and is being seen today for sinus infection.  Onset was end of Dec with URI that has continued with congestion, cough, and sinus pressure Associated symptoms are ongoing congestion and sinus pressure with green mucus, sore throat and headache Modifying factors are mucinex, and promethazine  DM without much improvement Denies chest pain, shortness of breath, fevers, chills    Problems:  Patient Active Problem List   Diagnosis Date Noted   Cough 06/30/2022   Sacroiliac joint pain 06/11/2020   Pain of left hip joint 06/10/2020   Lumbar radiculopathy 06/10/2020   Panlobular emphysema (HCC) 05/27/2020   Hypoglycemia 05/27/2020   COPD (chronic obstructive pulmonary disease) (HCC) 05/04/2020   Migraine with typical aura 01/11/2018   History of cocaine use, remote, > 20 years 01/11/2018   Seasonal allergic rhinitis due to pollen 01/11/2018   Vitamin D deficiency 01/11/2018   Obesity (BMI 30-39.9), s/p bariatric surgery 01/11/2018   ADHD, followed by Psych, on low dose Adderall 01/08/2018   Fatty liver 01/07/2018   Status post bariatric surgery 01/07/2018   GERD (gastroesophageal reflux disease) 01/07/2018   Osteoarthritis of right knee 01/07/2018   Sacroiliitis (HCC) 01/07/2018  Obstructive sleep apnea syndrome, not on CPAP since weight loss 10/20/2016    Allergies:  Allergies  Allergen Reactions   Gadolinium Nausea And Vomiting   Sulfa Antibiotics Hives and Rash    Red and swollen ears   Codeine Nausea And Vomiting   Iodinated Contrast Media Nausea And Vomiting    Nausea and vomiting after IV contast dye approximately 35  years ago.   Poison Ivy Extract Rash    Severe rash   Tramadol Nausea And Vomiting    Migraine    Medications:  Current Outpatient Medications:    acetaminophen  (TYLENOL ) 500 MG tablet, Take 1,000 mg by mouth every 6 (six) hours as needed for moderate pain or headache., Disp: , Rfl:    albuterol (VENTOLIN HFA) 108 (90 Base) MCG/ACT inhaler, Inhale 2 puffs into the lungs every 6 (six) hours as needed for wheezing or shortness of breath. (Patient not taking: Reported on 08/31/2022), Disp: , Rfl:    amitriptyline (ELAVIL) 10 MG tablet, Take 10 mg by mouth at bedtime., Disp: , Rfl:    amphetamine-dextroamphetamine (ADDERALL) 10 MG tablet, Take 10 mg by mouth 3 (three) times daily., Disp: , Rfl: 0   Ascorbic Acid (VITAMIN C PO), Take by mouth daily., Disp: , Rfl:    baclofen  (LIORESAL ) 10 MG tablet, Take 20 mg by mouth at bedtime., Disp: , Rfl:    BIOTIN PO, Take by mouth daily., Disp: , Rfl:    celecoxib (CELEBREX) 200 MG capsule, Take 200 mg by mouth daily. , Disp: , Rfl:    cholecalciferol (VITAMIN D) 25 MCG (1000 UNIT) tablet, Take 1,000 Units by mouth daily., Disp: , Rfl:    Cyanocobalamin  (VITAMIN B-12 PO), Take by mouth daily., Disp: , Rfl:    FLUoxetine (PROZAC) 20 MG capsule, Take 20 mg by mouth daily., Disp: , Rfl:    fluticasone (FLONASE) 50 MCG/ACT nasal spray, Place 1 spray into both nostrils daily as needed for allergies or rhinitis., Disp: , Rfl:    furosemide (LASIX) 20 MG tablet, Take 20 mg by mouth daily as needed (swelling).  (Patient not taking: Reported on 08/31/2022), Disp: , Rfl:    Galcanezumab-gnlm 120 MG/ML SOAJ, Inject into the skin., Disp: , Rfl:    Ginkgo Biloba (GNP GINGKO BILOBA EXTRACT PO), Take by mouth daily., Disp: , Rfl:    omeprazole (PRILOSEC) 40 MG capsule, Take 40 mg by mouth daily., Disp: , Rfl:    ondansetron  (ZOFRAN  ODT) 4 MG disintegrating tablet, Take 1 tablet (4 mg total) by mouth every 8 (eight) hours as needed., Disp: 20 tablet, Rfl: 0    oxyCODONE -acetaminophen  (PERCOCET) 5-325 MG tablet, Take 1 tablet by mouth every 4 (four) hours as needed for severe pain. (Patient not taking: Reported on 08/31/2022), Disp: 10 tablet, Rfl: 0   potassium chloride (K-DUR) 10 MEQ tablet, Take 10 mEq by mouth daily as needed (when taking furosemide.). (Patient not taking: Reported on 08/31/2022), Disp: , Rfl:    traZODone (DESYREL) 100 MG tablet, Take 100 mg by mouth at bedtime., Disp: , Rfl:    Ubrogepant  (UBRELVY ) 50 MG TABS, Take 1 tablet (50 mg total) by mouth daily as needed (migraine). (Patient not taking: Reported on 08/31/2022), Disp: 30 tablet, Rfl: 0   verapamil (CALAN) 80 MG tablet, Take 80 mg by mouth 2 (two) times daily., Disp: , Rfl:   Observations/Objective: Patient is well-developed, well-nourished in no acute distress.  Resting comfortably  at home.  Head is normocephalic, atraumatic.  No labored breathing.  Speech is clear and coherent with logical content.  Patient is alert and oriented at baseline.  Congestion tone Cough present  Assessment and Plan:   1. Acute bacterial sinusitis (Primary)  - amoxicillin -clavulanate (AUGMENTIN ) 875-125 MG tablet; Take 1 tablet by mouth 2 (two) times daily for 7 days.  Dispense: 14 tablet; Refill: 0  - Increased rest - Increasing Fluids - Acetaminophen  / ibuprofen  as needed for fever/pain.  - Salt water gargling, chloraseptic spray and throat lozenges - Mucinex if mucus is present and increasing.  - Saline nasal spray if congestion or if nasal passages feel dry. - Humidifying the air.    Reviewed side effects, risks and benefits of medication.    Patient acknowledged agreement and understanding of the plan.   Past Medical, Surgical, Social History, Allergies, and Medications have been Reviewed.    Follow Up Instructions: I discussed the assessment and treatment plan with the patient. The patient was provided an opportunity to ask questions and all were answered. The patient  agreed with the plan and demonstrated an understanding of the instructions.  A copy of instructions were sent to the patient via MyChart unless otherwise noted below.    The patient was advised to call back or seek an in-person evaluation if the symptoms worsen or if the condition fails to improve as anticipated.    Chiquita CHRISTELLA Barefoot, NP

## 2023-08-03 DIAGNOSIS — M5442 Lumbago with sciatica, left side: Secondary | ICD-10-CM | POA: Diagnosis not present

## 2023-08-03 DIAGNOSIS — M5416 Radiculopathy, lumbar region: Secondary | ICD-10-CM | POA: Diagnosis not present

## 2023-08-03 DIAGNOSIS — G8929 Other chronic pain: Secondary | ICD-10-CM | POA: Diagnosis not present

## 2023-08-03 DIAGNOSIS — M7062 Trochanteric bursitis, left hip: Secondary | ICD-10-CM | POA: Diagnosis not present

## 2023-08-07 DIAGNOSIS — K219 Gastro-esophageal reflux disease without esophagitis: Secondary | ICD-10-CM | POA: Diagnosis not present

## 2023-08-07 DIAGNOSIS — E559 Vitamin D deficiency, unspecified: Secondary | ICD-10-CM | POA: Diagnosis not present

## 2023-08-07 DIAGNOSIS — E162 Hypoglycemia, unspecified: Secondary | ICD-10-CM | POA: Diagnosis not present

## 2023-08-07 DIAGNOSIS — Z13220 Encounter for screening for lipoid disorders: Secondary | ICD-10-CM | POA: Diagnosis not present

## 2023-08-07 DIAGNOSIS — J431 Panlobular emphysema: Secondary | ICD-10-CM | POA: Diagnosis not present

## 2023-08-07 DIAGNOSIS — R8290 Unspecified abnormal findings in urine: Secondary | ICD-10-CM | POA: Diagnosis not present

## 2023-08-07 DIAGNOSIS — Z79899 Other long term (current) drug therapy: Secondary | ICD-10-CM | POA: Diagnosis not present

## 2023-08-07 DIAGNOSIS — Z0000 Encounter for general adult medical examination without abnormal findings: Secondary | ICD-10-CM | POA: Diagnosis not present

## 2023-08-07 DIAGNOSIS — R5383 Other fatigue: Secondary | ICD-10-CM | POA: Diagnosis not present

## 2023-08-07 DIAGNOSIS — F909 Attention-deficit hyperactivity disorder, unspecified type: Secondary | ICD-10-CM | POA: Diagnosis not present

## 2023-08-07 DIAGNOSIS — R5381 Other malaise: Secondary | ICD-10-CM | POA: Diagnosis not present

## 2023-09-25 DIAGNOSIS — M25552 Pain in left hip: Secondary | ICD-10-CM | POA: Diagnosis not present

## 2023-09-25 DIAGNOSIS — G8929 Other chronic pain: Secondary | ICD-10-CM | POA: Diagnosis not present

## 2023-10-19 ENCOUNTER — Other Ambulatory Visit: Payer: Self-pay | Admitting: Physical Medicine & Rehabilitation

## 2023-10-19 DIAGNOSIS — M25552 Pain in left hip: Secondary | ICD-10-CM | POA: Diagnosis not present

## 2023-10-19 DIAGNOSIS — M5442 Lumbago with sciatica, left side: Secondary | ICD-10-CM | POA: Diagnosis not present

## 2023-10-19 DIAGNOSIS — G8929 Other chronic pain: Secondary | ICD-10-CM | POA: Diagnosis not present

## 2023-10-19 DIAGNOSIS — M5416 Radiculopathy, lumbar region: Secondary | ICD-10-CM | POA: Diagnosis not present

## 2023-10-19 DIAGNOSIS — M7062 Trochanteric bursitis, left hip: Secondary | ICD-10-CM | POA: Diagnosis not present

## 2023-10-22 ENCOUNTER — Ambulatory Visit
Admission: RE | Admit: 2023-10-22 | Discharge: 2023-10-22 | Disposition: A | Discharge status: Home / Self Care | Source: Ambulatory Visit | Attending: Physical Medicine & Rehabilitation | Admitting: Physical Medicine & Rehabilitation

## 2023-10-22 DIAGNOSIS — M5442 Lumbago with sciatica, left side: Secondary | ICD-10-CM

## 2023-10-22 DIAGNOSIS — M48061 Spinal stenosis, lumbar region without neurogenic claudication: Secondary | ICD-10-CM | POA: Diagnosis not present

## 2023-11-16 DIAGNOSIS — M25552 Pain in left hip: Secondary | ICD-10-CM | POA: Diagnosis not present

## 2023-11-16 DIAGNOSIS — M5442 Lumbago with sciatica, left side: Secondary | ICD-10-CM | POA: Diagnosis not present

## 2023-11-16 DIAGNOSIS — G8929 Other chronic pain: Secondary | ICD-10-CM | POA: Diagnosis not present

## 2023-11-16 DIAGNOSIS — M5416 Radiculopathy, lumbar region: Secondary | ICD-10-CM | POA: Diagnosis not present

## 2023-11-16 DIAGNOSIS — M7062 Trochanteric bursitis, left hip: Secondary | ICD-10-CM | POA: Diagnosis not present

## 2023-11-17 DIAGNOSIS — M5416 Radiculopathy, lumbar region: Secondary | ICD-10-CM | POA: Diagnosis not present

## 2023-12-11 DIAGNOSIS — G8929 Other chronic pain: Secondary | ICD-10-CM | POA: Diagnosis not present

## 2023-12-11 DIAGNOSIS — M47816 Spondylosis without myelopathy or radiculopathy, lumbar region: Secondary | ICD-10-CM | POA: Diagnosis not present

## 2023-12-11 DIAGNOSIS — M5442 Lumbago with sciatica, left side: Secondary | ICD-10-CM | POA: Diagnosis not present

## 2023-12-11 DIAGNOSIS — M25552 Pain in left hip: Secondary | ICD-10-CM | POA: Diagnosis not present

## 2023-12-11 DIAGNOSIS — M7062 Trochanteric bursitis, left hip: Secondary | ICD-10-CM | POA: Diagnosis not present

## 2023-12-11 DIAGNOSIS — M5416 Radiculopathy, lumbar region: Secondary | ICD-10-CM | POA: Diagnosis not present

## 2023-12-18 DIAGNOSIS — M47816 Spondylosis without myelopathy or radiculopathy, lumbar region: Secondary | ICD-10-CM | POA: Diagnosis not present

## 2024-01-04 DIAGNOSIS — M47816 Spondylosis without myelopathy or radiculopathy, lumbar region: Secondary | ICD-10-CM | POA: Diagnosis not present

## 2024-01-22 DIAGNOSIS — M5416 Radiculopathy, lumbar region: Secondary | ICD-10-CM | POA: Diagnosis not present

## 2024-01-22 DIAGNOSIS — M7062 Trochanteric bursitis, left hip: Secondary | ICD-10-CM | POA: Diagnosis not present

## 2024-01-22 DIAGNOSIS — M5442 Lumbago with sciatica, left side: Secondary | ICD-10-CM | POA: Diagnosis not present

## 2024-01-22 DIAGNOSIS — G8929 Other chronic pain: Secondary | ICD-10-CM | POA: Diagnosis not present

## 2024-01-22 DIAGNOSIS — M47816 Spondylosis without myelopathy or radiculopathy, lumbar region: Secondary | ICD-10-CM | POA: Diagnosis not present

## 2024-02-14 DIAGNOSIS — M47816 Spondylosis without myelopathy or radiculopathy, lumbar region: Secondary | ICD-10-CM | POA: Diagnosis not present

## 2024-02-21 DIAGNOSIS — G43109 Migraine with aura, not intractable, without status migrainosus: Secondary | ICD-10-CM | POA: Diagnosis not present

## 2024-03-06 DIAGNOSIS — R519 Headache, unspecified: Secondary | ICD-10-CM | POA: Diagnosis not present

## 2024-03-06 DIAGNOSIS — Z1231 Encounter for screening mammogram for malignant neoplasm of breast: Secondary | ICD-10-CM | POA: Diagnosis not present

## 2024-03-06 DIAGNOSIS — D649 Anemia, unspecified: Secondary | ICD-10-CM | POA: Diagnosis not present

## 2024-03-06 DIAGNOSIS — K219 Gastro-esophageal reflux disease without esophagitis: Secondary | ICD-10-CM | POA: Diagnosis not present

## 2024-03-06 DIAGNOSIS — G8929 Other chronic pain: Secondary | ICD-10-CM | POA: Diagnosis not present

## 2024-03-06 DIAGNOSIS — F909 Attention-deficit hyperactivity disorder, unspecified type: Secondary | ICD-10-CM | POA: Diagnosis not present

## 2024-03-06 DIAGNOSIS — E559 Vitamin D deficiency, unspecified: Secondary | ICD-10-CM | POA: Diagnosis not present

## 2024-03-06 DIAGNOSIS — Z79899 Other long term (current) drug therapy: Secondary | ICD-10-CM | POA: Diagnosis not present

## 2024-03-06 DIAGNOSIS — Z1331 Encounter for screening for depression: Secondary | ICD-10-CM | POA: Diagnosis not present

## 2024-03-06 DIAGNOSIS — L989 Disorder of the skin and subcutaneous tissue, unspecified: Secondary | ICD-10-CM | POA: Diagnosis not present

## 2024-03-06 DIAGNOSIS — J431 Panlobular emphysema: Secondary | ICD-10-CM | POA: Diagnosis not present

## 2024-03-06 DIAGNOSIS — Z0000 Encounter for general adult medical examination without abnormal findings: Secondary | ICD-10-CM | POA: Diagnosis not present

## 2024-03-07 ENCOUNTER — Other Ambulatory Visit: Payer: Self-pay | Admitting: Internal Medicine

## 2024-03-07 DIAGNOSIS — Z1231 Encounter for screening mammogram for malignant neoplasm of breast: Secondary | ICD-10-CM

## 2024-03-19 DIAGNOSIS — D649 Anemia, unspecified: Secondary | ICD-10-CM | POA: Diagnosis not present

## 2024-03-25 DIAGNOSIS — Z961 Presence of intraocular lens: Secondary | ICD-10-CM | POA: Diagnosis not present

## 2024-03-25 DIAGNOSIS — G43109 Migraine with aura, not intractable, without status migrainosus: Secondary | ICD-10-CM | POA: Diagnosis not present

## 2024-03-25 DIAGNOSIS — D3132 Benign neoplasm of left choroid: Secondary | ICD-10-CM | POA: Diagnosis not present

## 2024-04-01 DIAGNOSIS — G8929 Other chronic pain: Secondary | ICD-10-CM | POA: Diagnosis not present

## 2024-04-01 DIAGNOSIS — M5442 Lumbago with sciatica, left side: Secondary | ICD-10-CM | POA: Diagnosis not present

## 2024-04-01 DIAGNOSIS — M7062 Trochanteric bursitis, left hip: Secondary | ICD-10-CM | POA: Diagnosis not present

## 2024-04-01 DIAGNOSIS — M47816 Spondylosis without myelopathy or radiculopathy, lumbar region: Secondary | ICD-10-CM | POA: Diagnosis not present

## 2024-04-01 DIAGNOSIS — M5416 Radiculopathy, lumbar region: Secondary | ICD-10-CM | POA: Diagnosis not present

## 2024-04-11 DIAGNOSIS — Z79899 Other long term (current) drug therapy: Secondary | ICD-10-CM | POA: Diagnosis not present

## 2024-04-11 DIAGNOSIS — D649 Anemia, unspecified: Secondary | ICD-10-CM | POA: Diagnosis not present

## 2024-05-09 ENCOUNTER — Other Ambulatory Visit: Payer: Self-pay

## 2024-05-09 ENCOUNTER — Emergency Department
Admission: EM | Admit: 2024-05-09 | Discharge: 2024-05-09 | Discharge status: Against Medical Advice | Attending: Emergency Medicine | Admitting: Emergency Medicine

## 2024-05-09 DIAGNOSIS — R519 Headache, unspecified: Secondary | ICD-10-CM | POA: Diagnosis not present

## 2024-05-09 DIAGNOSIS — Z5321 Procedure and treatment not carried out due to patient leaving prior to being seen by health care provider: Secondary | ICD-10-CM | POA: Diagnosis not present

## 2024-05-09 NOTE — ED Triage Notes (Signed)
 Patient states headache since last night; history of migraines. States no relief with Sumatriptan. Reports her Neurologist left the practice so she hasn't been able to get in with new doctor until December.

## 2024-05-13 DIAGNOSIS — G43109 Migraine with aura, not intractable, without status migrainosus: Secondary | ICD-10-CM | POA: Diagnosis not present

## 2024-05-16 DIAGNOSIS — M5416 Radiculopathy, lumbar region: Secondary | ICD-10-CM | POA: Diagnosis not present

## 2024-05-24 DIAGNOSIS — M791 Myalgia, unspecified site: Secondary | ICD-10-CM | POA: Diagnosis not present

## 2024-05-24 DIAGNOSIS — J069 Acute upper respiratory infection, unspecified: Secondary | ICD-10-CM | POA: Diagnosis not present

## 2024-06-03 DIAGNOSIS — M5416 Radiculopathy, lumbar region: Secondary | ICD-10-CM | POA: Diagnosis not present

## 2024-06-03 DIAGNOSIS — G8929 Other chronic pain: Secondary | ICD-10-CM | POA: Diagnosis not present

## 2024-06-03 DIAGNOSIS — M7062 Trochanteric bursitis, left hip: Secondary | ICD-10-CM | POA: Diagnosis not present

## 2024-06-03 DIAGNOSIS — M5442 Lumbago with sciatica, left side: Secondary | ICD-10-CM | POA: Diagnosis not present

## 2024-06-03 DIAGNOSIS — M1612 Unilateral primary osteoarthritis, left hip: Secondary | ICD-10-CM | POA: Diagnosis not present

## 2024-06-10 DIAGNOSIS — G43909 Migraine, unspecified, not intractable, without status migrainosus: Secondary | ICD-10-CM | POA: Diagnosis not present

## 2024-07-18 ENCOUNTER — Other Ambulatory Visit: Payer: Self-pay | Admitting: *Deleted

## 2024-07-18 DIAGNOSIS — N200 Calculus of kidney: Secondary | ICD-10-CM

## 2024-07-31 ENCOUNTER — Ambulatory Visit: Payer: Self-pay | Admitting: Urology

## 2024-07-31 ENCOUNTER — Encounter: Payer: Self-pay | Admitting: Urology

## 2024-08-07 ENCOUNTER — Other Ambulatory Visit: Payer: Self-pay | Admitting: Internal Medicine

## 2024-08-07 DIAGNOSIS — R519 Headache, unspecified: Secondary | ICD-10-CM

## 2024-08-07 DIAGNOSIS — M542 Cervicalgia: Secondary | ICD-10-CM

## 2024-08-09 ENCOUNTER — Other Ambulatory Visit: Payer: Self-pay | Admitting: Internal Medicine

## 2024-08-09 DIAGNOSIS — M542 Cervicalgia: Secondary | ICD-10-CM

## 2024-08-16 ENCOUNTER — Ambulatory Visit: Admission: RE | Admit: 2024-08-16

## 2024-08-16 DIAGNOSIS — R519 Headache, unspecified: Secondary | ICD-10-CM

## 2024-08-16 DIAGNOSIS — M542 Cervicalgia: Secondary | ICD-10-CM
